# Patient Record
Sex: Female | Born: 1945 | Race: Black or African American | Hispanic: No | Marital: Single | Smoking: Former smoker
Health system: Southern US, Community
[De-identification: ages and names within clinical notes are randomized; demographics above are authoritative.]

## PROBLEM LIST (undated history)

## (undated) DIAGNOSIS — I10 Essential (primary) hypertension: Secondary | ICD-10-CM

## (undated) DIAGNOSIS — Z9889 Other specified postprocedural states: Secondary | ICD-10-CM

## (undated) DIAGNOSIS — E785 Hyperlipidemia, unspecified: Secondary | ICD-10-CM

## (undated) DIAGNOSIS — K5900 Constipation, unspecified: Secondary | ICD-10-CM

## (undated) DIAGNOSIS — M1990 Unspecified osteoarthritis, unspecified site: Secondary | ICD-10-CM

## (undated) DIAGNOSIS — K625 Hemorrhage of anus and rectum: Secondary | ICD-10-CM

## (undated) DIAGNOSIS — R112 Nausea with vomiting, unspecified: Secondary | ICD-10-CM

## (undated) HISTORY — PX: ABDOMINAL HYSTERECTOMY: SHX81

## (undated) HISTORY — PX: APPENDECTOMY: SHX54

## (undated) HISTORY — PX: BREAST EXCISIONAL BIOPSY: SUR124

---

## 2008-06-27 ENCOUNTER — Ambulatory Visit: Payer: Self-pay

## 2014-02-09 DIAGNOSIS — I10 Essential (primary) hypertension: Secondary | ICD-10-CM | POA: Insufficient documentation

## 2014-02-09 DIAGNOSIS — E7800 Pure hypercholesterolemia, unspecified: Secondary | ICD-10-CM | POA: Insufficient documentation

## 2014-10-03 ENCOUNTER — Ambulatory Visit: Payer: Self-pay | Admitting: Family Medicine

## 2014-10-11 ENCOUNTER — Ambulatory Visit: Payer: Self-pay | Admitting: Family Medicine

## 2015-03-14 IMAGING — US US BREAST*L* LIMITED INC AXILLA
1 series · 3 of 3 positions shown · non-contrast
Comparison: Prior

CLINICAL DATA: Screening callback for questioned left breast
subareolar masses

EXAM:
DIGITAL DIAGNOSTIC  left MAMMOGRAM
ULTRASOUND left BREAST

[Series 1: us breast*left* limited inc axilla · 0.08mm/px · 3 of 3 slices shown]
[im 1/3]
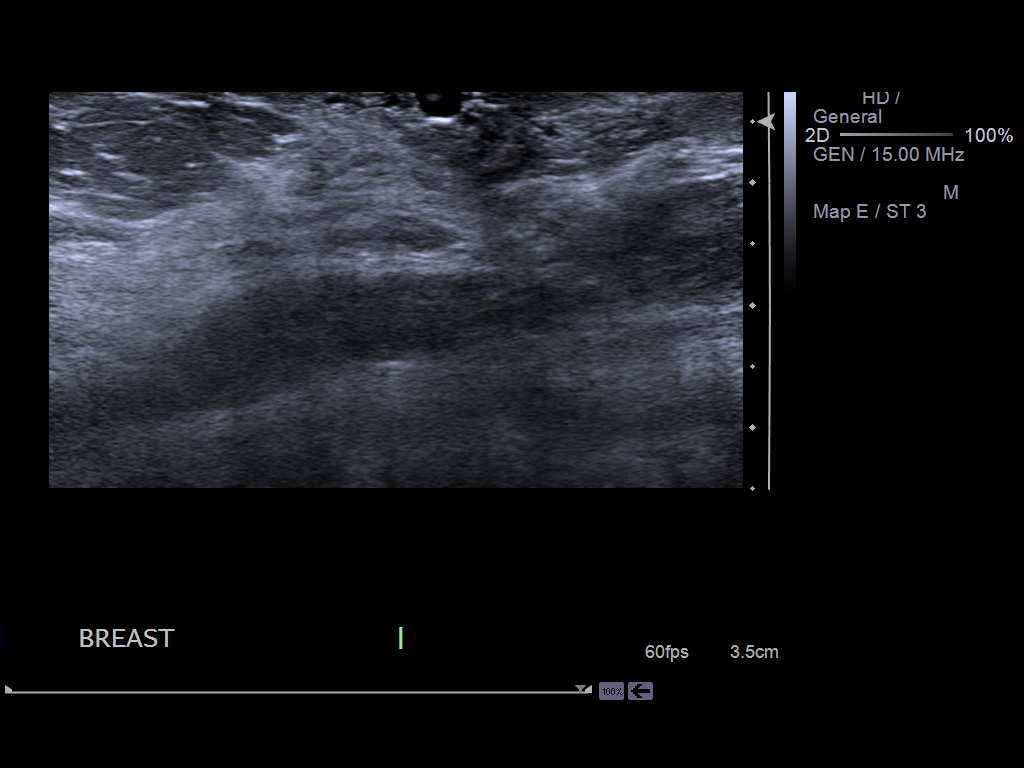
[im 2/3]
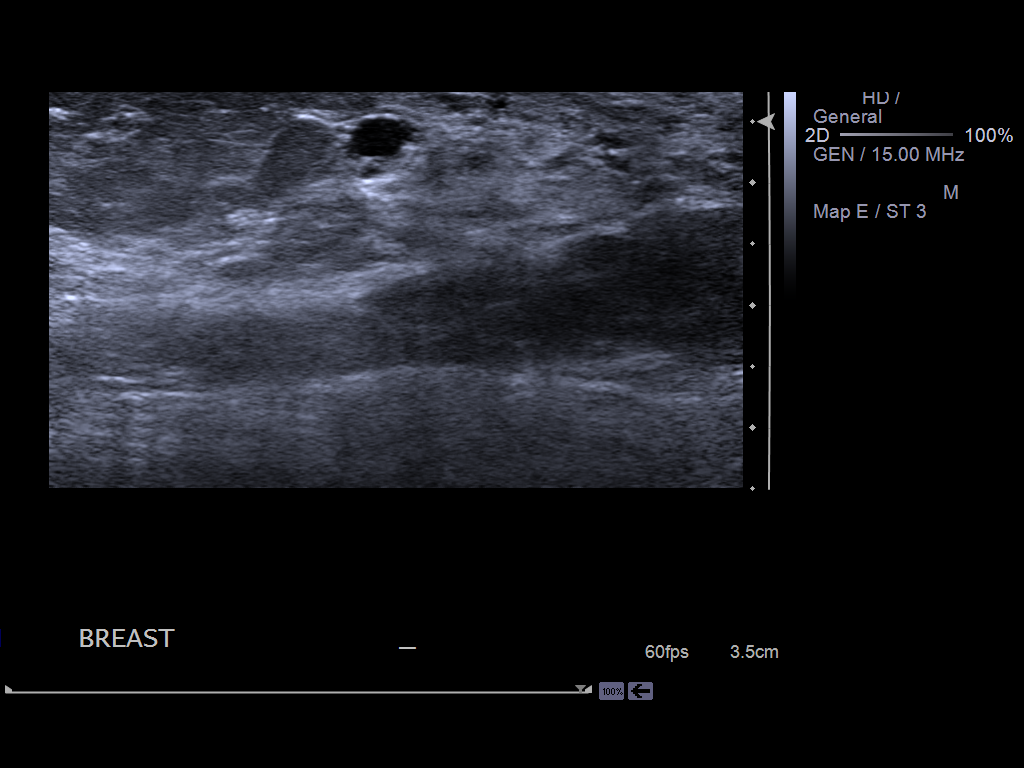
[im 3/3]
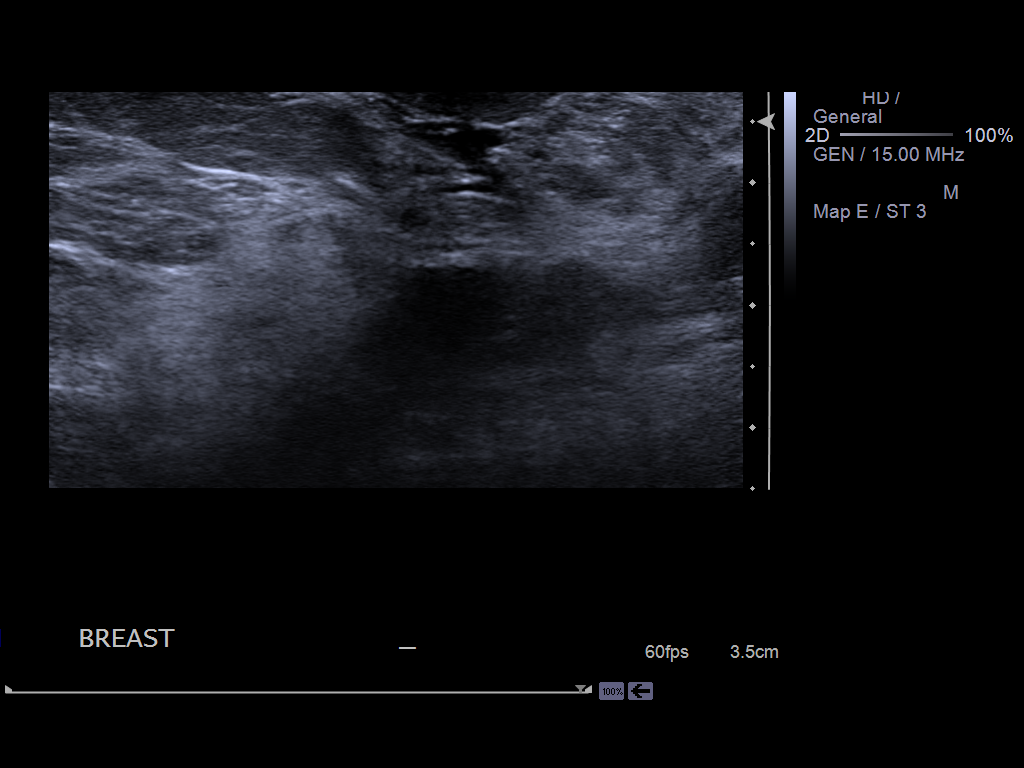

[3 of 3 positions shown; findings below may reference images not displayed]

ACR Breast Density Category c: The breast tissue is heterogeneously
dense, which may obscure small masses.
FINDINGS: The left breast retroareolar parenchyma is nodular without focal
measurable mass. Distortion from previous remote benign excisional
biopsy is reidentified.

On physical exam, I palpate no abnormality in the left retroareolar
breast.

Ultrasound is performed, showing scattered areas of duct ectasia in
the left retroareolar breast with out intraductal filling defect,
hypervascularity, or other abnormality identified.
IMPRESSION: Probably benign duct ectasia in the left retroareolar breast,
possibly related to scarring from remote prior surgery. No
intraductal abnormality is identified. This finding is of unknown
clinical significance and general consensus for this finding is to
pursue followup left diagnostic mammography and possibly ultrasound
in 6 months, for possible detection of an occult intraductal or
extra ductal mass.

RECOMMENDATION:
Left diagnostic mammogram and possibly ultrasound in 6 months

I have discussed the findings and recommendations with the patient.
Results were also provided in writing at the conclusion of the
visit. If applicable, a reminder letter will be sent to the patient
regarding the next appointment.

BI-RADS CATEGORY  3: Probably benign.

## 2015-03-28 ENCOUNTER — Other Ambulatory Visit: Payer: Self-pay | Admitting: Family Medicine

## 2015-03-28 DIAGNOSIS — Z1231 Encounter for screening mammogram for malignant neoplasm of breast: Secondary | ICD-10-CM

## 2015-07-10 NOTE — Discharge Instructions (Signed)

## 2015-07-11 ENCOUNTER — Ambulatory Visit: Payer: Medicare Other | Admitting: Anesthesiology

## 2015-07-11 ENCOUNTER — Ambulatory Visit
Admission: RE | Admit: 2015-07-11 | Discharge: 2015-07-11 | Disposition: A | Discharge status: Home / Self Care | Payer: Medicare Other | Source: Ambulatory Visit | Attending: Gastroenterology | Admitting: Gastroenterology

## 2015-07-11 ENCOUNTER — Encounter
Admission: RE | Disposition: A | Discharge status: Home / Self Care | Payer: Self-pay | Source: Ambulatory Visit | Attending: Gastroenterology

## 2015-07-11 ENCOUNTER — Encounter: Payer: Self-pay | Admitting: *Deleted

## 2015-07-11 DIAGNOSIS — K91840 Postprocedural hemorrhage and hematoma of a digestive system organ or structure following a digestive system procedure: Secondary | ICD-10-CM | POA: Diagnosis not present

## 2015-07-11 DIAGNOSIS — K922 Gastrointestinal hemorrhage, unspecified: Secondary | ICD-10-CM | POA: Diagnosis not present

## 2015-07-11 HISTORY — PX: COLONOSCOPY: SHX5424

## 2015-07-11 HISTORY — DX: Constipation, unspecified: K59.00

## 2015-07-11 HISTORY — PX: POLYPECTOMY: SHX149

## 2015-07-11 HISTORY — DX: Hemorrhage of anus and rectum: K62.5

## 2015-07-11 HISTORY — DX: Essential (primary) hypertension: I10

## 2015-07-11 HISTORY — DX: Unspecified osteoarthritis, unspecified site: M19.90

## 2015-07-11 HISTORY — DX: Hyperlipidemia, unspecified: E78.5

## 2015-07-11 HISTORY — DX: Other specified postprocedural states: Z98.890

## 2015-07-11 HISTORY — DX: Nausea with vomiting, unspecified: R11.2

## 2015-07-11 SURGERY — COLONOSCOPY
Anesthesia: Monitor Anesthesia Care

## 2015-07-11 MED ORDER — LIDOCAINE HCL (CARDIAC) 20 MG/ML IV SOLN: INTRAVENOUS | Status: DC | PRN

## 2015-07-11 MED ORDER — ACETAMINOPHEN 325 MG PO TABS: 325.0000 mg | ORAL_TABLET | ORAL | Status: DC | PRN

## 2015-07-11 MED ORDER — STERILE WATER FOR IRRIGATION IR SOLN
Status: DC | PRN
  Administered 2015-07-11: 09:00:00

## 2015-07-11 MED ORDER — PROPOFOL 10 MG/ML IV BOLUS
INTRAVENOUS | Status: DC | PRN
  Administered 2015-07-11 (×16): 20 mg via INTRAVENOUS

## 2015-07-11 MED ORDER — ACETAMINOPHEN 160 MG/5ML PO SOLN: 325.0000 mg | ORAL | Status: DC | PRN

## 2015-07-11 MED ORDER — LACTATED RINGERS IV SOLN
INTRAVENOUS | Status: DC
  Administered 2015-07-11: 09:00:00 via INTRAVENOUS

## 2015-07-11 SURGICAL SUPPLY — 30 items
CANISTER SUCT 1200ML W/VALVE (MISCELLANEOUS) ×4 IMPLANT
FCP ESCP3.2XJMB 240X2.8X (MISCELLANEOUS)
FORCEPS BIOP RAD 4 LRG CAP 4 (CUTTING FORCEPS) IMPLANT
FORCEPS BIOP RJ4 240 W/NDL (MISCELLANEOUS)
FORCEPS ESCP3.2XJMB 240X2.8X (MISCELLANEOUS) IMPLANT
GOWN CVR UNV OPN BCK APRN NK (MISCELLANEOUS) ×2 IMPLANT
GOWN ISOL THUMB LOOP REG UNIV (MISCELLANEOUS) ×2
GOWN STRL REUS W/ TWL LRG LVL3 (GOWN DISPOSABLE) ×2 IMPLANT
GOWN STRL REUS W/TWL LRG LVL3 (GOWN DISPOSABLE) ×2
HEMOCLIP INSTINCT (CLIP) ×8 IMPLANT
INJECTOR VARIJECT VIN23 (MISCELLANEOUS) IMPLANT
KIT CO2 TUBING (TUBING) IMPLANT
KIT DEFENDO VALVE AND CONN (KITS) IMPLANT
KIT ENDO PROCEDURE OLY (KITS) ×4 IMPLANT
LIGATOR MULTIBAND 6SHOOTER MBL (MISCELLANEOUS) IMPLANT
MARKER SPOT ENDO TATTOO 5ML (MISCELLANEOUS) IMPLANT
PAD GROUND ADULT SPLIT (MISCELLANEOUS) IMPLANT
SNARE SHORT THROW 13M SML OVAL (MISCELLANEOUS) IMPLANT
SNARE SHORT THROW 30M LRG OVAL (MISCELLANEOUS) IMPLANT
SPOT EX ENDOSCOPIC TATTOO (MISCELLANEOUS)
SUCTION POLY TRAP 4CHAMBER (MISCELLANEOUS) ×4 IMPLANT
TRAP SUCTION POLY (MISCELLANEOUS) IMPLANT
TUBING CONN 6MMX3.1M (TUBING)
TUBING SUCTION CONN 0.25 STRL (TUBING) IMPLANT
UNDERPAD 30X60 958B10 (PK) (MISCELLANEOUS) IMPLANT
VALVE BIOPSY ENDO (VALVE) IMPLANT
VARIJECT INJECTOR VIN23 (MISCELLANEOUS)
WATER AUXILLARY (MISCELLANEOUS) IMPLANT
WATER STERILE IRR 250ML POUR (IV SOLUTION) IMPLANT
WATER STERILE IRR 500ML POUR (IV SOLUTION) IMPLANT

## 2015-07-11 NOTE — Anesthesia Postprocedure Evaluation (Signed)
  Anesthesia Post-op Note  Patient: Dawn Santana  Procedure(s) Performed: Procedure(s): COLONOSCOPY (N/A) POLYPECTOMY INTESTINAL  Anesthesia type:MAC  Patient location: PACU  Post pain: Pain level controlled  Post assessment: Post-op Vital signs reviewed, Patient's Cardiovascular Status Stable, Respiratory Function Stable, Patent Airway and No signs of Nausea or vomiting  Post vital signs: Reviewed and stable  Last Vitals:  Filed Vitals:   07/11/15 1000  BP: 114/68  Pulse: 65  Temp:   Resp: 18    Level of consciousness: awake, alert  and patient cooperative  Complications: No apparent anesthesia complications

## 2015-07-11 NOTE — H&P (Signed)
  Date of Initial H&P:06/26/2015 History reviewed, patient examined, no change in status, stable for surgery. 

## 2015-07-11 NOTE — Anesthesia Preprocedure Evaluation (Signed)
Anesthesia Evaluation  Patient identified by MRN, date of birth, ID band  Reviewed: Allergy & Precautions, H&P , NPO status , Patient's Chart, lab work & pertinent test results  Airway Mallampati: I  TM Distance: >3 FB Neck ROM: full    Dental no notable dental hx.    Pulmonary former smoker,    Pulmonary exam normal       Cardiovascular hypertension, Rhythm:regular Rate:Normal     Neuro/Psych    GI/Hepatic   Endo/Other    Renal/GU      Musculoskeletal   Abdominal   Peds  Hematology   Anesthesia Other Findings   Reproductive/Obstetrics                             Anesthesia Physical Anesthesia Plan  ASA: II  Anesthesia Plan: MAC   Post-op Pain Management:    Induction:   Airway Management Planned:   Additional Equipment:   Intra-op Plan:   Post-operative Plan:   Informed Consent: I have reviewed the patients History and Physical, chart, labs and discussed the procedure including the risks, benefits and alternatives for the proposed anesthesia with the patient or authorized representative who has indicated his/her understanding and acceptance.     Plan Discussed with: CRNA  Anesthesia Plan Comments:         Anesthesia Quick Evaluation  

## 2015-07-11 NOTE — Transfer of Care (Signed)
Immediate Anesthesia Transfer of Care Note  Patient: Dawn Santana  Procedure(s) Performed: Procedure(s): COLONOSCOPY (N/A) POLYPECTOMY INTESTINAL  Patient Location: PACU  Anesthesia Type: MAC  Level of Consciousness: awake, alert  and patient cooperative  Airway and Oxygen Therapy: Patient Spontanous Breathing and Patient connected to supplemental oxygen  Post-op Assessment: Post-op Vital signs reviewed, Patient's Cardiovascular Status Stable, Respiratory Function Stable, Patent Airway and No signs of Nausea or vomiting  Post-op Vital Signs: Reviewed and stable  Complications: No apparent anesthesia complications

## 2015-07-11 NOTE — Anesthesia Procedure Notes (Signed)
Procedure Name: MAC Performed by: Benedetta Sundstrom Pre-anesthesia Checklist: Patient identified, Emergency Drugs available, Suction available, Patient being monitored and Timeout performed Patient Re-evaluated:Patient Re-evaluated prior to inductionOxygen Delivery Method: Nasal cannula Intubation Type: IV induction     

## 2015-07-11 NOTE — Op Note (Signed)
[facility] Patient Name: Dawn Santana Procedure Date: 07/11/2015 9:09 AM MRN: [id number] Account #: [id number] Date of Birth: 1946/01/16 Admit Type: Outpatient Age: 69 Room: [facility] OR ROOM 01 Gender: Female Note Status: Finalized Procedure:         Colonoscopy Indications:       Heme positive stool Providers:         Ezzard Standing. Bluford Kaufmann, MD Referring MD:      Haynes Kerns (Referring MD) Medicines:         Monitored Anesthesia Care Complications:     No immediate complications. Procedure:         Pre-Anesthesia Assessment:                    - Prior to the procedure, a History and Physical was                     performed, and patient medications, allergies and                     sensitivities were reviewed. The patient's tolerance of                     previous anesthesia was reviewed.                    - The risks and benefits of the procedure and the sedation                     options and risks were discussed with the patient. All                     questions were answered and informed consent was obtained.                    - After reviewing the risks and benefits, the patient was                     deemed in satisfactory condition to undergo the procedure.                    After obtaining informed consent, the colonoscope was                     passed under direct vision. Throughout the procedure, the                     patient's blood pressure, pulse, and oxygen saturations                     were monitored continuously. The Olympus CF H180AL                     colonoscope (S#: [id number]) was introduced through the anus                     and advanced to the the cecum, identified by appendiceal                     orifice and ileocecal valve. The colonoscopy was performed                     without difficulty. The patient tolerated the procedure  well. The quality of the bowel preparation was good. Findings:       Many sessile polyps were found in the cecum. The polyps were small in       size. These polyps were removed with a hot snare. Resection and       retrieval were complete.      A large polyp was found in the cecum. The polyp was sessile. The polyp       was removed with a hot snare. Resection and retrieval were complete. To       prevent bleeding after the polypectomy, one hemostatic clip was       successfully placed. There was no bleeding at the end of the procedure.      A few sessile polyps were found in the ascending colon. The polyps were       small in size. These polyps were removed with a hot snare. Resection and       retrieval were complete.      A large polyp was found in the ascending colon. The polyp was sessile.       The polyp was removed with a hot snare. Resection and retrieval were       complete.      A small polyp was found in the rectum. The polyp was sessile. The polyp       was removed with a hot snare. Resection and retrieval were complete.      A large polyp was found in the rectum. The polyp was sessile. The polyp       was removed with a hot snare. Resection and retrieval were complete.      The exam was otherwise without abnormality.      There was a medium-sized lipoma, in the sigmoid colon. Impression:        - Many small polyps in the cecum. Resected and retrieved.                    - One large polyp in the cecum. Resected and retrieved.                     Clip was placed.                    - A few small polyps in the ascending colon. Resected and                     retrieved.                    - One large polyp in the ascending colon. Resected and                     retrieved.                    - One small polyp in the rectum. Resected and retrieved.                    - One large polyp in the rectum. Resected and retrieved.                    - The examination was otherwise normal.                    - Medium-sized lipoma in the sigmoid  colon. Recommendation:    - Discharge patient to  home.                    - Await pathology results.                    - Repeat colonoscopy in 6 months for surveillance based on                     pathology results. Procedure Code(s): --- Professional ---                    [phone], Colonoscopy, flexible; with removal of tumor(s),                     polyp(s), or other lesion(s) by snare technique Diagnosis Code(s): --- Professional ---                    D12.0, Benign neoplasm of cecum                    D12.2, Benign neoplasm of ascending colon                    K62.1, Rectal polyp                    D17.5, Benign lipomatous neoplasm of intra-abdominal organs                    R19.5, Other fecal abnormalities CPT copyright 2014 American Medical Association. All rights reserved. The codes documented in this report are preliminary and upon coder review may  be revised to meet current compliance requirements. Wallace Cullens, MD 07/11/2015 9:52:35 AM This report has been signed electronically. Number of Addenda: 0 Note Initiated On: 07/11/2015 9:09 AM Scope Withdrawal Time: 0 hours 28 minutes 24 seconds  Total Procedure Duration: 0 hours 32 minutes 23 seconds       [facility]

## 2015-07-12 ENCOUNTER — Inpatient Hospital Stay
Admission: EM | Admit: 2015-07-12 | Discharge: 2015-07-14 | DRG: 920 | Disposition: A | Discharge status: Home / Self Care | Payer: Medicare Other | Attending: Internal Medicine | Admitting: Internal Medicine

## 2015-07-12 ENCOUNTER — Encounter: Payer: Self-pay | Admitting: Emergency Medicine

## 2015-07-12 DIAGNOSIS — I959 Hypotension, unspecified: Secondary | ICD-10-CM | POA: Diagnosis present

## 2015-07-12 DIAGNOSIS — Z79899 Other long term (current) drug therapy: Secondary | ICD-10-CM

## 2015-07-12 DIAGNOSIS — I10 Essential (primary) hypertension: Secondary | ICD-10-CM | POA: Diagnosis present

## 2015-07-12 DIAGNOSIS — Y838 Other surgical procedures as the cause of abnormal reaction of the patient, or of later complication, without mention of misadventure at the time of the procedure: Secondary | ICD-10-CM | POA: Diagnosis present

## 2015-07-12 DIAGNOSIS — M179 Osteoarthritis of knee, unspecified: Secondary | ICD-10-CM | POA: Diagnosis present

## 2015-07-12 DIAGNOSIS — D62 Acute posthemorrhagic anemia: Secondary | ICD-10-CM | POA: Diagnosis present

## 2015-07-12 DIAGNOSIS — K91840 Postprocedural hemorrhage and hematoma of a digestive system organ or structure following a digestive system procedure: Secondary | ICD-10-CM | POA: Diagnosis present

## 2015-07-12 DIAGNOSIS — K922 Gastrointestinal hemorrhage, unspecified: Secondary | ICD-10-CM

## 2015-07-12 DIAGNOSIS — E876 Hypokalemia: Secondary | ICD-10-CM | POA: Diagnosis present

## 2015-07-12 DIAGNOSIS — D6489 Other specified anemias: Secondary | ICD-10-CM

## 2015-07-12 DIAGNOSIS — Z87891 Personal history of nicotine dependence: Secondary | ICD-10-CM | POA: Diagnosis not present

## 2015-07-12 DIAGNOSIS — E785 Hyperlipidemia, unspecified: Secondary | ICD-10-CM | POA: Diagnosis present

## 2015-07-12 DIAGNOSIS — Z23 Encounter for immunization: Secondary | ICD-10-CM

## 2015-07-12 DIAGNOSIS — E861 Hypovolemia: Secondary | ICD-10-CM | POA: Diagnosis present

## 2015-07-12 DIAGNOSIS — I4891 Unspecified atrial fibrillation: Secondary | ICD-10-CM | POA: Diagnosis present

## 2015-07-12 DIAGNOSIS — Z888 Allergy status to other drugs, medicaments and biological substances status: Secondary | ICD-10-CM

## 2015-07-12 LAB — HEMOGLOBIN AND HEMATOCRIT, BLOOD
HCT: 25.6 % — ABNORMAL LOW (ref 35.0–47.0)
Hemoglobin: 8.9 g/dL — ABNORMAL LOW (ref 12.0–16.0)

## 2015-07-12 LAB — CBC WITH DIFFERENTIAL/PLATELET
BASOS ABS: 0 10*3/uL (ref 0–0.1)
BASOS PCT: 0 %
EOS PCT: 1 %
Eosinophils Absolute: 0.1 10*3/uL (ref 0–0.7)
HCT: 25 % — ABNORMAL LOW (ref 35.0–47.0)
Hemoglobin: 8.5 g/dL — ABNORMAL LOW (ref 12.0–16.0)
Lymphocytes Relative: 14 %
Lymphs Abs: 1.7 10*3/uL (ref 1.0–3.6)
MCH: 30.3 pg (ref 26.0–34.0)
MCHC: 34.1 g/dL (ref 32.0–36.0)
MCV: 88.7 fL (ref 80.0–100.0)
MONO ABS: 0.7 10*3/uL (ref 0.2–0.9)
Monocytes Relative: 6 %
Neutro Abs: 9.5 10*3/uL — ABNORMAL HIGH (ref 1.4–6.5)
Neutrophils Relative %: 79 %
PLATELETS: 180 10*3/uL (ref 150–440)
RBC: 2.82 MIL/uL — ABNORMAL LOW (ref 3.80–5.20)
RDW: 13.5 % (ref 11.5–14.5)
WBC: 12.1 10*3/uL — ABNORMAL HIGH (ref 3.6–11.0)

## 2015-07-12 LAB — COMPREHENSIVE METABOLIC PANEL
ALBUMIN: 3 g/dL — AB (ref 3.5–5.0)
ALT: 11 U/L — ABNORMAL LOW (ref 14–54)
ANION GAP: 6 (ref 5–15)
AST: 15 U/L (ref 15–41)
Alkaline Phosphatase: 34 U/L — ABNORMAL LOW (ref 38–126)
BUN: 12 mg/dL (ref 6–20)
CHLORIDE: 111 mmol/L (ref 101–111)
CO2: 25 mmol/L (ref 22–32)
Calcium: 7.9 mg/dL — ABNORMAL LOW (ref 8.9–10.3)
Creatinine, Ser: 0.83 mg/dL (ref 0.44–1.00)
GFR calc Af Amer: >60 mL/min (ref >60)
Glucose, Bld: 156 mg/dL — ABNORMAL HIGH (ref 65–99)
POTASSIUM: 2.8 mmol/L — AB (ref 3.5–5.1)
Sodium: 142 mmol/L (ref 135–145)
Total Bilirubin: 0.8 mg/dL (ref 0.3–1.2)
Total Protein: 4.9 g/dL — ABNORMAL LOW (ref 6.5–8.1)

## 2015-07-12 LAB — URINALYSIS COMPLETE WITH MICROSCOPIC (ARMC ONLY)
BILIRUBIN URINE: NEGATIVE
GLUCOSE, UA: NEGATIVE mg/dL
KETONES UR: NEGATIVE mg/dL
Leukocytes, UA: NEGATIVE
NITRITE: NEGATIVE
PROTEIN: NEGATIVE mg/dL
SPECIFIC GRAVITY, URINE: 1.005 (ref 1.005–1.030)
pH: 7 (ref 5.0–8.0)

## 2015-07-12 LAB — PREPARE RBC (CROSSMATCH): Order Confirmation: POSITIVE

## 2015-07-12 LAB — MRSA PCR SCREENING: MRSA BY PCR: NEGATIVE

## 2015-07-12 LAB — TROPONIN I

## 2015-07-12 LAB — PROTIME-INR
INR: 1.18
PROTHROMBIN TIME: 15.2 s — AB (ref 11.4–15.0)

## 2015-07-12 LAB — POTASSIUM: POTASSIUM: 3.4 mmol/L — AB (ref 3.5–5.1)

## 2015-07-12 LAB — MAGNESIUM: Magnesium: 1.6 mg/dL — ABNORMAL LOW (ref 1.7–2.4)

## 2015-07-12 LAB — ABO/RH: ABO/RH(D): O POS

## 2015-07-12 MED ORDER — MAGNESIUM SULFATE 2 GM/50ML IV SOLN
2.0000 g | Freq: Once | INTRAVENOUS | Status: AC
  Administered 2015-07-12: 2 g via INTRAVENOUS
  Filled 2015-07-12: qty 50

## 2015-07-12 MED ORDER — POTASSIUM CHLORIDE IN NACL 20-0.9 MEQ/L-% IV SOLN
INTRAVENOUS | Status: DC
  Administered 2015-07-12 – 2015-07-13 (×3): via INTRAVENOUS
  Filled 2015-07-12 (×6): qty 1000

## 2015-07-12 MED ORDER — POTASSIUM CHLORIDE CRYS ER 20 MEQ PO TBCR
40.0000 meq | EXTENDED_RELEASE_TABLET | Freq: Two times a day (BID) | ORAL | Status: DC
  Administered 2015-07-12 – 2015-07-13 (×2): 40 meq via ORAL
  Filled 2015-07-12 (×3): qty 2

## 2015-07-12 MED ORDER — ACETAMINOPHEN 325 MG PO TABS: 650.0000 mg | ORAL_TABLET | Freq: Four times a day (QID) | ORAL | Status: DC | PRN

## 2015-07-12 MED ORDER — POTASSIUM CHLORIDE CRYS ER 20 MEQ PO TBCR
60.0000 meq | EXTENDED_RELEASE_TABLET | Freq: Once | ORAL | Status: AC
  Administered 2015-07-12: 60 meq via ORAL
  Filled 2015-07-12: qty 3

## 2015-07-12 MED ORDER — INFLUENZA VAC SPLIT QUAD 0.5 ML IM SUSY: 0.5000 mL | PREFILLED_SYRINGE | INTRAMUSCULAR | Status: DC

## 2015-07-12 MED ORDER — SODIUM CHLORIDE 0.9 % IJ SOLN
3.0000 mL | Freq: Two times a day (BID) | INTRAMUSCULAR | Status: DC
  Administered 2015-07-12 – 2015-07-14 (×3): 3 mL via INTRAVENOUS

## 2015-07-12 MED ORDER — SODIUM CHLORIDE 0.9 % IV SOLN
10.0000 mL/h | Freq: Once | INTRAVENOUS | Status: AC
  Administered 2015-07-12: 10 mL/h via INTRAVENOUS

## 2015-07-12 MED ORDER — ACETAMINOPHEN 650 MG RE SUPP: 650.0000 mg | Freq: Four times a day (QID) | RECTAL | Status: DC | PRN

## 2015-07-12 MED ORDER — PNEUMOCOCCAL VAC POLYVALENT 25 MCG/0.5ML IJ INJ: 0.5000 mL | INJECTION | INTRAMUSCULAR | Status: DC

## 2015-07-12 NOTE — Progress Notes (Signed)
RN notified Dr. Renae Gloss that patients repeat hgb is 8.9 and that potassium has not resulted as of yet.  RN notified MD that patient has not passed any blood since admission and that patient's blood pressure has maintained. MD gave order to transfer patient to any floor with tele.

## 2015-07-12 NOTE — ED Provider Notes (Signed)
[facility] Emergency Department Provider Note     Time seen: ----------------------------------------- 7:38 AM on 07/12/2015 -----------------------------------------    I have reviewed the triage vital signs and the nursing notes.   HISTORY  Chief Complaint No chief complaint on file.    HPI Dawn Santana is a 69 y.o. female who presents to ER with acute rectal bleeding. Patient had a colonoscopy performed by Dr. Bluford Kaufmann yesterday.She states that afterwards everything was going well, she was not having bleeding. Suddenly in the middle of night she began having rectal bleeding, it felt like she needed to have a bowel movement and she was passing pure blood. This happened again about 4 AM and then later in the morning she passed out after she sat up on the side of the bed. EMS got there and she was hypotensive. They have started IV fluid on her and her blood pressure has returned to normal. Currently supine she has no complaints, has not had any further bleeding since she's been here. She has not had a history of this before, denies any abdominal pain.   Past Medical History  Diagnosis Date  . Hyperlipidemia   . Rectal bleeding   . Constipation     occasional  . Hypertension   . PONV (postoperative nausea and vomiting)   . Arthritis     knees and joints    There are no active problems to display for this patient.   Past Surgical History  Procedure Laterality Date  . Appendectomy    . Abdominal hysterectomy      Allergies Other  Social History Social History  Substance Use Topics  . Smoking status: Former Smoker -- 0.25 packs/day for 6 years    Types: Cigarettes    Quit date: 03/21/1989  . Smokeless tobacco: Never Used  . Alcohol Use: Yes     Comment: occasional glass of wine or mixed drink    Review of Systems Constitutional: Negative for fever. Eyes: Negative for visual changes. ENT: Negative for sore throat. Cardiovascular:  Negative for chest pain. Respiratory: Negative for shortness of breath. Gastrointestinal: Negative for abdominal pain, vomiting and diarrhea. Positive for rectal bleeding Genitourinary: Negative for dysuria. Musculoskeletal: Negative for back pain. Skin: Negative for rash. Neurological: Negative for headaches, positive for weakness  10-point ROS otherwise negative.  ____________________________________________   PHYSICAL EXAM:  VITAL SIGNS: ED Triage Vitals  Enc Vitals Group     BP --      Pulse --      Resp --      Temp --      Temp src --      SpO2 --      Weight --      Height --      Head Cir --      Peak Flow --      Pain Score --      Pain Loc --      Pain Edu? --      Excl. in GC? --     Constitutional: Alert and oriented. Well appearing and in no distress. Eyes: Conjunctivae are normal. PERRL. Normal extraocular movements. ENT   Head: Normocephalic and atraumatic.   Nose: No congestion/rhinnorhea.   Mouth/Throat: Mucous membranes are moist.   Neck: No stridor. Cardiovascular: Normal rate, regular rhythm. Normal and symmetric distal pulses are present in all extremities. No murmurs, rubs, or gallops. Respiratory: Normal respiratory effort without tachypnea nor retractions. Breath sounds are clear and equal bilaterally.  No wheezes/rales/rhonchi. Gastrointestinal: Soft and nontender. No distention. No abdominal bruits.  Rectal: Bright red blood is noted rectally. Musculoskeletal: Nontender with normal range of motion in all extremities. No joint effusions.  No lower extremity tenderness nor edema. Neurologic:  Normal speech and language. No gross focal neurologic deficits are appreciated. Speech is normal. No gait instability. Skin:  Skin is warm, dry and intact. No rash noted. Psychiatric: Mood and affect are normal. Speech and behavior are normal. Patient exhibits appropriate insight and judgment. ____________________________________________  EKG:  Interpreted by me. Atrial fibrillation with a rate of 79 bpm, nonspecific ST and T-wave changes, normal axis. No evidence of acute infarction.  ____________________________________________  ED COURSE:  Pertinent labs & imaging results that were available during my care of the patient were reviewed by me and considered in my medical decision making (see chart for details). I have ordered a emergency release blood for the patient, she will continue saline infusion. We'll check vitals and labs and reevaluate. ____________________________________________    LABS (pertinent positives/negatives)  Labs Reviewed  CBC WITH DIFFERENTIAL/PLATELET - Abnormal; Notable for the following:    WBC 12.1 (*)    RBC 2.82 (*)    Hemoglobin 8.5 (*)    HCT 25.0 (*)    Neutro Abs 9.5 (*)    All other components within normal limits  COMPREHENSIVE METABOLIC PANEL - Abnormal; Notable for the following:    Potassium 2.8 (*)    Glucose, Bld 156 (*)    Calcium 7.9 (*)    Total Protein 4.9 (*)    Albumin 3.0 (*)    ALT 11 (*)    Alkaline Phosphatase 34 (*)    All other components within normal limits  PROTIME-INR - Abnormal; Notable for the following:    Prothrombin Time 15.2 (*)    All other components within normal limits  TROPONIN I  URINALYSIS COMPLETEWITH MICROSCOPIC ([facility] ONLY)  CBG MONITORING, ED  TYPE AND SCREEN   CRITICAL CARE Performed by: Emily Filbert   Total critical care time: 30 minutes  Critical care time was exclusive of separately billable procedures and treating other patients.  Critical care was necessary to treat or prevent imminent or life-threatening deterioration.  Critical care was time spent personally by me on the following activities: development of treatment plan with patient and/or surrogate as well as nursing, discussions with consultants, evaluation of patient's response to treatment, examination of patient, obtaining history from patient or surrogate,  ordering and performing treatments and interventions, ordering and review of laboratory studies, ordering and review of radiographic studies, pulse oximetry and re-evaluation of patient's condition.  ____________________________________________  FINAL ASSESSMENT AND PLAN  Rectal bleeding, atrial fibrillation  Plan: Patient with labs and imaging as dictated above. Case was discussed with Dr. Bluford Kaufmann, he states he has not entirely surprised by her bleeding. She had large polyps on colonoscopy yesterday that required clips to be placed. Atrial fibrillations likely secondary to anemia. She will likely need a blood transfusion, will need serial H&H and reevaluation. At this point her heart rate and blood pressure are normal.   Emily Filbert, MD   Emily Filbert, MD 07/12/15 [phone]

## 2015-07-12 NOTE — Consult Note (Signed)
GI Inpatient Consult Note  Reason for Consult:  Rectal bleeding post colonoscopy   History of Present Illness: Dawn Santana is a 69 y.o. female reports that she had a colonoscopy yesterday as outpatient, went home and felt fine.  Had lunch and then had dinner.  She reports she woke up about 2 o'clock this morning, thinking she had to urinate, and experienced bright red blood per rectum.  She laid back down and reports she woke up again about 430 and experienced another episode, however this time with dark blood.  She reports 2 more episodes of dark blood.  She reports that she became very dizzy and began sweating.  She had a syncopal episode and her daughter called EMS.  In emergency department they determined that she was currently in AFib, had a hemoglobin level of 8.5, low potassium.  Her colonoscopy was significant for several large polyps.  One polyp, after removed had a hemostatic clip successfully placed.  There was no bleeding at the end of the procedure.  She is due for repeat colonoscopy in 6 months.  Past Medical History:  Past Medical History  Diagnosis Date  . Hyperlipidemia   . Rectal bleeding   . Constipation     occasional  . Hypertension   . PONV (postoperative nausea and vomiting)   . Arthritis     knees and joints    Problem List: Patient Active Problem List   Diagnosis Date Noted  . Acute post-hemorrhagic anemia 07/12/2015    Past Surgical History: Past Surgical History  Procedure Laterality Date  . Appendectomy    . Abdominal hysterectomy    . Colonoscopy N/A 07/11/2015    Procedure: COLONOSCOPY;  Surgeon: Wallace Cullens, MD;  Location: [facility];  Service: Gastroenterology;  Laterality: N/A;  . Polypectomy  07/11/2015    Procedure: POLYPECTOMY INTESTINAL;  Surgeon: Wallace Cullens, MD;  Location: [facility];  Service: Gastroenterology;;  . Breast biopsy      Allergies: Allergies  Allergen Reactions  . Other Other (See Comments)   Statins-hmg-coa reductase inhibitors give muscle aches    Home Medications: Prescriptions prior to admission  Medication Sig Dispense Refill Last Dose  . Ascorbic Acid (VITAMIN C) 1000 MG tablet Take 1,000 mg by mouth daily.   07/11/2015 at Unknown time  . Calcium-Magnesium-Vitamin D 185-50-100 MG-MG-UNIT CAPS Take 2 tablets by mouth daily.   07/11/2015 at Unknown time  . Cholecalciferol (VITAMIN D-3) 1000 UNITS CAPS Take by mouth.   07/11/2015 at Unknown time  . Multiple Vitamin (MULTIVITAMIN) tablet Take 1 tablet by mouth daily.   07/11/2015 at Unknown time  . omega-3 fish oil (MAXEPA) 1000 MG CAPS capsule Take 3 capsules by mouth daily.   07/11/2015 at Unknown time  . potassium chloride (K-DUR,KLOR-CON) 10 MEQ tablet Take 10 mEq by mouth daily.   07/11/2015 at Unknown time  . triamterene-hydrochlorothiazide (MAXZIDE) 75-50 MG tablet Take 1 tablet by mouth daily.   07/11/2015 at Unknown time  . vitamin B-12 (CYANOCOBALAMIN) 1000 MCG tablet Take 1,000 mcg by mouth daily.   07/11/2015 at Unknown time  . vitamin E 1000 UNIT capsule Take 1,000 Units by mouth daily.   07/11/2015 at Unknown time   Home medication reconciliation was completed with the patient.   Scheduled Inpatient Medications:   . potassium chloride  40 mEq Oral BID  . sodium chloride  3 mL Intravenous Q12H    Continuous Inpatient Infusions:   . 0.9 % NaCl with KCl 20  mEq / L 75 mL/hr at 07/12/15 1053    PRN Inpatient Medications:  acetaminophen **OR** acetaminophen  Family History: family history includes Breast cancer in her mother; Diabetes in her mother; Heart failure in her mother.    Social History:   reports that she quit smoking about 26 years ago. Her smoking use included Cigarettes. She has a 1.5 pack-year smoking history. She has never used smokeless tobacco. She reports that she drinks alcohol. She reports that she does not use illicit drugs.   Review of Systems: Constitutional: Weight is stable.  Eyes: No  changes in vision. ENT: No oral lesions, sore throat.  GI: see HPI.  Heme/Lymph: No easy bruising.  CV: No chest pain.  GU: No hematuria.  Integumentary: No rashes.  Neuro: No headaches.  Psych: No depression/anxiety.  Endocrine: No heat/cold intolerance.  Allergic/Immunologic: No urticaria.  Resp: No cough, SOB.  Musculoskeletal: No joint swelling.    Physical Examination: BP 113/71 mmHg  Pulse 85  Temp(Src) 98.6 F (37 C) (Axillary)  Resp 14  Ht  (1.803 m)  Wt 91.7 kg (202 lb 2.6 oz)  BMI 28.21 kg/m2  SpO2 100% Gen: NAD, alert and oriented x 4 HEENT: PEERLA, EOMI, Neck: supple, no JVD or thyromegaly Chest: CTA bilaterally, no wheezes, crackles, or other adventitious sounds CV: RRR, no m/g/c/r Abd: soft, slight generalized tenderness, ND, +BS in all four quadrants; no HSM, guarding, ridigity, or rebound tenderness Ext: no edema, well perfused with 2+ pulses, Skin: no rash or lesions noted Lymph: no LAD  Data: Lab Results  Component Value Date   WBC 12.1* 07/12/2015   HGB 8.5* 07/12/2015   HCT 25.0* 07/12/2015   MCV 88.7 07/12/2015   PLT 180 07/12/2015    Recent Labs Lab 07/12/15 0741  HGB 8.5*   Lab Results  Component Value Date   NA 142 07/12/2015   K 2.8* 07/12/2015   CL 111 07/12/2015   CO2 25 07/12/2015   BUN 12 07/12/2015   CREATININE 0.83 07/12/2015   Lab Results  Component Value Date   ALT 11* 07/12/2015   AST 15 07/12/2015   ALKPHOS 34* 07/12/2015   BILITOT 0.8 07/12/2015    Recent Labs Lab 07/12/15 0741  INR 1.18   Assessment/Plan: [name] is a 69 y.o. female with acute rectal bleeding status post colonoscopy.  Last episode of bleeding was around 8am this morning.  hgb 8.5, potassium 2.8, magnesium 1.6.  Recommendations: Per Dr. Bluford Kaufmann; Post polypectomy bleeding. Near syncope with low BP and anemia. Low K. Afib initially. Now in NSR. No further bleeding since coming to ER. Continue IVF. Continue to moniter for bleeding.  Transfuse as needed. If more bleeding today, then bowel prep tonight for repeat colonoscopy to possibly place more hemoclips to bleeding site. Thanks.  We will continue to follow with you. Thank you for the consult. Please call with questions or concerns.  Carney Harder, PA-C  I personally performed these services.

## 2015-07-12 NOTE — Consult Note (Signed)
  Pt seen and examined. Please see C. Peggye Pitt' notes. Post polypectomy bleeding. Near syncope with low BP and anemia. Low K. Afib initially. Now in NSR. No further bleeding since coming to ER. Continue IVF. Continue to moniter for bleeding. Transfuse as needed. If more bleeding today, then bowel prep tonight for repeat colonoscopy to possibly place more hemoclips to bleeding site. Thanks.

## 2015-07-12 NOTE — Progress Notes (Signed)
Patient moved to room 126 at this time by bed with Kathlene November, orderly.  Off unit tele monitor on and verified with Evon, tele clerk.

## 2015-07-12 NOTE — ED Notes (Signed)
Dr Mayford Knife notified of critical potassium value at this time.

## 2015-07-12 NOTE — Progress Notes (Signed)
Report called to [facility], RN on 1C. Patient is A&Ox4. Denies pain. No blood or blood in stool. VSS. Tolerating clear liquid diet. Family visited throughout shift. Patient will be moving to room 126.

## 2015-07-12 NOTE — H&P (Signed)
[facility] - [place] at [facility]   PATIENT NAME: Dawn Santana    MR#:  [id number]  DATE OF BIRTH:  [birth year]  DATE OF ADMISSION:  07/12/2015  PRIMARY CARE PHYSICIAN: Sula Rumple, MD   REQUESTING/REFERRING PHYSICIAN: Daryel November, M.D.  CHIEF COMPLAINT:   Chief Complaint  Patient presents with  . Rectal Bleeding    HISTORY OF PRESENT ILLNESS:  [name]  is a 69 y.o. female with a known history of hypertension and hypokalemia. She had a screening colonoscopy done yesterday by Dr. Bluford Kaufmann gastroenterology for microscopic blood on guaiac cards. She had numerous polyps removed including some large polyps in the cecum. She had some abdominal cramping last night. At 2:15 this morning she felt like she had some diarrhea she wiped and she saw blood. Noticed about a cupful of blood at that time in the toilet. At 4:15 AM again had about a couple blood loss from the rectum. At 6:15 AM, she felt like she was going to pass out she stood up and she was leaning against the closet and then she collapsed and fell to the floor. No loss of consciousness. Blood rushed out with clumps and she felt sweaty and hot. When EMS got her up to the stretcher she lost more blood. In the ER her hemoglobin was 8.5 and a recent hemoglobin was 13.4 a few weeks ago. The ER physician ordered for a transfusion of blood. Initial EMS blood pressure was in the 70s systolic. She was also found to be in atrial fibrillation.  PAST MEDICAL HISTORY:   Past Medical History  Diagnosis Date  . Hyperlipidemia   . Rectal bleeding   . Constipation     occasional  . Hypertension   . PONV (postoperative nausea and vomiting)   . Arthritis     knees and joints    PAST SURGICAL HISTORY:   Past Surgical History  Procedure Laterality Date  . Appendectomy    . Abdominal hysterectomy    . Colonoscopy N/A 07/11/2015    Procedure: COLONOSCOPY;  Surgeon: Wallace Cullens, MD;  Location: [facility];  Service: Gastroenterology;  Laterality: N/A;  . Polypectomy  07/11/2015    Procedure: POLYPECTOMY INTESTINAL;  Surgeon: Wallace Cullens, MD;  Location: [facility];  Service: Gastroenterology;;  . Breast biopsy      SOCIAL HISTORY:   Social History  Substance Use Topics  . Smoking status: Former Smoker -- 0.25 packs/day for 6 years    Types: Cigarettes    Quit date: 03/21/1989  . Smokeless tobacco: Never Used  . Alcohol Use: Yes     Comment: occasional glass of wine or mixed drink    FAMILY HISTORY:   Family History  Problem Relation Age of Onset  . Diabetes Mother   . Heart failure Mother   . Breast cancer Mother     DRUG ALLERGIES:   Allergies  Allergen Reactions  . Other Other (See Comments)    Statins-hmg-coa reductase inhibitors give muscle aches    REVIEW OF SYSTEMS:  CONSTITUTIONAL: No fever, positive for weakness, positive for sweating and hot. No weight loss EYES: No blurred or double vision. Wears glasses EARS, NOSE, AND THROAT: No tinnitus or ear pain. No sore throat. RESPIRATORY: No cough, shortness of breath, wheezing or hemoptysis.  CARDIOVASCULAR: No chest pain, orthopnea, edema.  GASTROINTESTINAL: No nausea, vomiting. Cramping in her abdomen. Bright red blood per rectum GENITOURINARY: No dysuria, hematuria.  ENDOCRINE: No polyuria, nocturia,  HEMATOLOGY: Anemia in the past SKIN: No rash or lesion. MUSCULOSKELETAL: Muscle aches with statin  NEUROLOGIC: No tingling, numbness. Syncope today PSYCHIATRY: No anxiety or depression.   MEDICATIONS AT HOME:   Prior to Admission medications   Medication Sig Start Date End Date Taking? Authorizing Provider  Ascorbic Acid (VITAMIN C) 1000 MG tablet Take 1,000 mg by mouth daily.   Yes Historical Provider, MD  Calcium-Magnesium-Vitamin D 185-50-100 MG-MG-UNIT CAPS Take 2 tablets by mouth daily.   Yes Historical Provider, MD  Cholecalciferol (VITAMIN D-3) 1000 UNITS CAPS Take by mouth.   Yes Historical  Provider, MD  Multiple Vitamin (MULTIVITAMIN) tablet Take 1 tablet by mouth daily.   Yes Historical Provider, MD  omega-3 fish oil (MAXEPA) 1000 MG CAPS capsule Take 3 capsules by mouth daily.   Yes Historical Provider, MD  potassium chloride (K-DUR,KLOR-CON) 10 MEQ tablet Take 10 mEq by mouth daily.   Yes Historical Provider, MD  triamterene-hydrochlorothiazide (MAXZIDE) 75-50 MG tablet Take 1 tablet by mouth daily.   Yes Historical Provider, MD  vitamin B-12 (CYANOCOBALAMIN) 1000 MCG tablet Take 1,000 mcg by mouth daily.   Yes Historical Provider, MD  vitamin E 1000 UNIT capsule Take 1,000 Units by mouth daily.   Yes Historical Provider, MD      VITAL SIGNS:  Blood pressure 114/55, pulse 76, temperature 98.1 F (36.7 C), temperature source Oral, resp. rate 11, height  (1.803 m), weight 90.81 kg (200 lb 3.2 oz), SpO2 100 %.  PHYSICAL EXAMINATION:  GENERAL:  69 y.o.-year-old patient lying in the bed with no acute distress.  EYES: Pupils equal, round, reactive to light and accommodation. No scleral icterus. Extraocular muscles intact. Conjunctiva pale HEENT: Head atraumatic, normocephalic. Oropharynx and nasopharynx clear.  NECK:  Supple, no jugular venous distention. No thyroid enlargement, no tenderness.  LUNGS: Normal breath sounds bilaterally, no wheezing, rales,rhonchi or crepitation. No use of accessory muscles of respiration.  CARDIOVASCULAR: S1, S2 irregularly irregular. No murmurs, rubs, or gallops.  ABDOMEN: Soft, nontender, nondistended. Bowel sounds present. No organomegaly or mass.  EXTREMITIES: Trace edema, no cyanosis, or clubbing.  NEUROLOGIC: Cranial nerves II through XII are intact. Muscle strength 5/5 in all extremities. Sensation intact. Gait not checked.  PSYCHIATRIC: The patient is alert and oriented x 3.  SKIN: No rash, lesion, or ulcer.   LABORATORY PANEL:   CBC  Recent Labs Lab 07/12/15 0741  WBC 12.1*  HGB 8.5*  HCT 25.0*  PLT 180    ------------------------------------------------------------------------------------------------------------------  Chemistries   Recent Labs Lab 07/12/15 0741  NA 142  K 2.8*  CL 111  CO2 25  GLUCOSE 156*  BUN 12  CREATININE 0.83  CALCIUM 7.9*  AST 15  ALT 11*  ALKPHOS 34*  BILITOT 0.8   ------------------------------------------------------------------------------------------------------------------  Cardiac Enzymes  Recent Labs Lab 07/12/15 0741  TROPONINI <0.03   ------------------------------------------------------------------------------------------------------------------   EKG:   Atrial fibrillation 79 bpm  IMPRESSION AND PLAN:   1. Acute hemorrhagic anemia, likely complication from polypectomy done yesterday with colonoscopy. We'll consult GI. ER physician ordered a transfusion of blood. Benefits and risks of transfusion explained. Admit to the CCU stepdown. If any further hemorrhaging can order a bleeding scan. Serial hemoglobins. 2. Initial hypotension and now tachycardia- watch closely in the CCU stepdown. Hold antihypertensives medications and give fluid support. 3. Severe hypokalemia- replace potassium orally and in IV fluids.Hydrochlorothiazide probably not a good medication for this patient. Check a magnesium and give IV magnesium. 4. Atrial fibrillation likely secondary to hypovolemia and  electrolyte abnormalities. Hopefully we'll convert to normal sinus rhythm once electrolytes are replaced and volume status improved.  All the records are reviewed and case discussed with ED provider. Management plans discussed with the patient, family and they are in agreement.  CODE STATUS: Full code  TOTAL TIME TAKING CARE OF THIS PATIENT: 55 minutes, patient will be monitored closely in the CCU at this point with initial hypotension and tachycardia.Renae Gloss, Duke Salvia.D on 07/12/2015 at 9:37 AM  Between 7am to 6pm - Pager - [phone]  After 6pm  call admission pager [phone]  [facility]  Office  [phone]  CC: Primary care physician; Sula Rumple, MD

## 2015-07-12 NOTE — ED Notes (Signed)
Pt states she had colonoscopy yesterday and had polyps removed. Pt states she woke up around 2 AM with rectal bleeding. Pt states she felt gushes of blood and became lightheaded this morning.

## 2015-07-13 LAB — CBC
HEMATOCRIT: 23.6 % — AB (ref 35.0–47.0)
Hemoglobin: 8.3 g/dL — ABNORMAL LOW (ref 12.0–16.0)
MCH: 30.6 pg (ref 26.0–34.0)
MCHC: 34.9 g/dL (ref 32.0–36.0)
MCV: 87.7 fL (ref 80.0–100.0)
PLATELETS: 161 10*3/uL (ref 150–440)
RBC: 2.7 MIL/uL — ABNORMAL LOW (ref 3.80–5.20)
RDW: 13.5 % (ref 11.5–14.5)
WBC: 6.8 10*3/uL (ref 3.6–11.0)

## 2015-07-13 LAB — BASIC METABOLIC PANEL
Anion gap: 4 — ABNORMAL LOW (ref 5–15)
BUN: 5 mg/dL — AB (ref 6–20)
CHLORIDE: 113 mmol/L — AB (ref 101–111)
CO2: 25 mmol/L (ref 22–32)
CREATININE: 0.69 mg/dL (ref 0.44–1.00)
Calcium: 8 mg/dL — ABNORMAL LOW (ref 8.9–10.3)
GFR calc Af Amer: >60 mL/min (ref >60)
GFR calc non Af Amer: >60 mL/min (ref >60)
GLUCOSE: 104 mg/dL — AB (ref 65–99)
POTASSIUM: 3.6 mmol/L (ref 3.5–5.1)
Sodium: 142 mmol/L (ref 135–145)

## 2015-07-13 LAB — TYPE AND SCREEN
ABO/RH(D): O POS
Antibody Screen: NEGATIVE
UNIT DIVISION: 0
UNIT DIVISION: 0
Unit division: 0

## 2015-07-13 LAB — HEMOGLOBIN
Hemoglobin: 8.3 g/dL — ABNORMAL LOW (ref 12.0–16.0)
Hemoglobin: 8.3 g/dL — ABNORMAL LOW (ref 12.0–16.0)

## 2015-07-13 LAB — SURGICAL PATHOLOGY

## 2015-07-13 LAB — POTASSIUM: POTASSIUM: 3.5 mmol/L (ref 3.5–5.1)

## 2015-07-13 LAB — MAGNESIUM: Magnesium: 1.9 mg/dL (ref 1.7–2.4)

## 2015-07-13 NOTE — Care Management (Signed)
Admitted to this facility following a Colonscopy with the diagnosis of acute post-hemorrhage. Lives with Uncle. Dawn Santana [phone]) and Dawn Santana [phone]). Last seen Dr. Elmer Ramp about 1.5 months ago. No home health. No skilled facility. Uses no aids for ambulation. Takes care of all basic and instrumental activities of daily living herself, drives. Family will transport. Gwenette Greet RN MSN Care Management [phone]

## 2015-07-13 NOTE — Progress Notes (Signed)
Pt had 1 bm, loose with medium amount of blood, but it was mixed with urine so amount of blood was unclear. Stool was bloody and watery.

## 2015-07-13 NOTE — Progress Notes (Signed)
Patient ID: Dawn Santana, female   DOB: 05/02/1946, 69 y.o.   MRN: [id number] [facility] PROGRESS NOTE  PCP: Sula Rumple, MD  HPI/Subjective: Patient had rectal bleeding this morning with urine. No solid bowel movement. Hard to tell how much blood as in there because it was mixed with the urine. Patient does not feel like she is going to pass out at this point.  Objective: Filed Vitals:   07/13/15 0547  BP: 107/55  Pulse: 67  Temp: 98.8 F (37.1 C)  Resp: 17    Filed Weights   07/12/15 0752 07/12/15 1137  Weight: 90.81 kg (200 lb 3.2 oz) 91.7 kg (202 lb 2.6 oz)    ROS: Review of Systems  Constitutional: Negative for fever and chills.  Eyes: Negative for blurred vision.  Respiratory: Negative for cough and shortness of breath.   Cardiovascular: Negative for chest pain.  Gastrointestinal: Positive for diarrhea and blood in stool. Negative for nausea, vomiting, abdominal pain and constipation.  Genitourinary: Negative for dysuria.  Musculoskeletal: Negative for joint pain.  Neurological: Positive for weakness. Negative for dizziness and headaches.   Exam: Physical Exam  Constitutional: She is oriented to person, place, and time.  HENT:  Nose: No mucosal edema.  Mouth/Throat: No oropharyngeal exudate or posterior oropharyngeal edema.  Eyes: Conjunctivae, EOM and lids are normal. Pupils are equal, round, and reactive to light.  Neck: No JVD present. Carotid bruit is not present. No edema present. No thyroid mass and no thyromegaly present.  Cardiovascular: S1 normal and S2 normal.  Exam reveals no gallop.   No murmur heard. Pulses:      Dorsalis pedis pulses are 2+ on the right side, and 2+ on the left side.  Respiratory: No respiratory distress. She has no wheezes. She has no rhonchi. She has no rales.  GI: Soft. Bowel sounds are normal. There is no tenderness.  Musculoskeletal:       Right ankle: She exhibits swelling.       Left ankle: She  exhibits swelling.  Lymphadenopathy:    She has no cervical adenopathy.  Neurological: She is alert and oriented to person, place, and time. No cranial nerve deficit.  Skin: Skin is warm. No rash noted. Nails show no clubbing.  Psychiatric: She has a normal mood and affect.    Data Reviewed: Basic Metabolic Panel:  Recent Labs Lab 07/12/15 0741 07/12/15 1705  NA 142  --   K 2.8* 3.4*  CL 111  --   CO2 25  --   GLUCOSE 156*  --   BUN 12  --   CREATININE 0.83  --   CALCIUM 7.9*  --   MG 1.6*  --    Liver Function Tests:  Recent Labs Lab 07/12/15 0741  AST 15  ALT 11*  ALKPHOS 34*  BILITOT 0.8  PROT 4.9*  ALBUMIN 3.0*   CBC:  Recent Labs Lab 07/12/15 0741 07/12/15 1705 07/13/15 0144 07/13/15 0528  WBC 12.1*  --   --  6.8  NEUTROABS 9.5*  --   --   --   HGB 8.5* 8.9* 8.3* 8.3*  HCT 25.0* 25.6*  --  23.6*  MCV 88.7  --   --  87.7  PLT 180  --   --  161     Recent Results (from the past 240 hour(s))  MRSA PCR Screening     Status: None   Collection Time: 07/12/15 11:40 AM  Result Value Ref Range Status  MRSA by PCR NEGATIVE NEGATIVE Final    Comment:        The GeneXpert MRSA Assay (FDA approved for NASAL specimens only), is one component of a comprehensive MRSA colonization surveillance program. It is not intended to diagnose MRSA infection nor to guide or monitor treatment for MRSA infections.       Scheduled Meds: . Influenza vac split quadrivalent PF  0.5 mL Intramuscular Tomorrow-1000  . pneumococcal 23 valent vaccine  0.5 mL Intramuscular Tomorrow-1000  . sodium chloride  3 mL Intravenous Q12H   Continuous Infusions: . 0.9 % NaCl with KCl 20 mEq / L 75 mL/hr at 07/13/15 0302    Assessment/Plan:  1. Acute hemorrhagic anemia. Lower GI bleed after polypectomy. Since the patient had another bleeding episode this morning, I will make nothing by mouth. I spoke with Dr. Bluford Kaufmann gastroenterology, who will come see the patient today and assess if  he wants to do another colonoscopy or not. Serial hemoglobins. Transfuse if needed. 2. Hypokalemia and hypomagnesemia- hydrochlorothiazide is not a good medication for this patient. Replace potassium in IV fluids.  3. Transient atrial fibrillation- resolved after fluid resuscitation and correction of electrolytes 4. Hypotension with history of hypertension- hydrochlorothiazide is not a good medication for this patient. Blood pressure currently on the lower side.  Code Status:     Code Status Orders        Start     Ordered   07/12/15 0929  Full code   Continuous     07/12/15 0930     Family Communication: family at bedside  Disposition Plan: home soon once bleeding subsides.  Consultants:  Gastroenterology  Time spent: 25 minutes.  Alford Highland  [facility]

## 2015-07-13 NOTE — Consult Note (Signed)
  GI Inpatient Follow-up Note  Patient Identification: Dawn Santana is a 69 y.o. female with post polypectomy bleeding.   Subjective: No bleeding yesterday. Small amount of bleeding this AM. Sounds like old blood. BP ok. NO longer feels lightheaded or dizzy. Hgb stable now.  Scheduled Inpatient Medications:  . Influenza vac split quadrivalent PF  0.5 mL Intramuscular Tomorrow-1000  . pneumococcal 23 valent vaccine  0.5 mL Intramuscular Tomorrow-1000  . sodium chloride  3 mL Intravenous Q12H    Continuous Inpatient Infusions:   . 0.9 % NaCl with KCl 20 mEq / L 75 mL/hr at 07/13/15 0302    PRN Inpatient Medications:  acetaminophen **OR** acetaminophen  Review of Systems: Constitutional: Weight is stable.  Eyes: No changes in vision. ENT: No oral lesions, sore throat.  GI: see HPI.  Heme/Lymph: No easy bruising.  CV: No chest pain.  GU: No hematuria.  Integumentary: No rashes.  Neuro: No headaches.  Psych: No depression/anxiety.  Endocrine: No heat/cold intolerance.  Allergic/Immunologic: No urticaria.  Resp: No cough, SOB.  Musculoskeletal: No joint swelling.    Physical Examination: BP 107/55 mmHg  Pulse 67  Temp(Src) 98.8 F (37.1 C) (Oral)  Resp 17  Ht  (1.803 m)  Wt 91.7 kg (202 lb 2.6 oz)  BMI 28.21 kg/m2  SpO2 100% Gen: NAD, alert and oriented x 4 HEENT: PEERLA, EOMI, Neck: supple, no JVD or thyromegaly Chest: CTA bilaterally, no wheezes, crackles, or other adventitious sounds CV: RRR, no m/g/c/r Abd: soft, NT, ND, +BS in all four quadrants; no HSM, guarding, ridigity, or rebound tenderness Ext: no edema, well perfused with 2+ pulses, Skin: no rash or lesions noted Lymph: no LAD  Data: Lab Results  Component Value Date   WBC 6.8 07/13/2015   HGB 8.3* 07/13/2015   HCT 23.6* 07/13/2015   MCV 87.7 07/13/2015   PLT 161 07/13/2015    Recent Labs Lab 07/12/15 1705 07/13/15 0144 07/13/15 0528  HGB 8.9* 8.3* 8.3*   Lab Results   Component Value Date   NA 142 07/13/2015   K 3.6 07/13/2015   CL 113* 07/13/2015   CO2 25 07/13/2015   BUN 5* 07/13/2015   CREATININE 0.69 07/13/2015   Lab Results  Component Value Date   ALT 11* 07/12/2015   AST 15 07/12/2015   ALKPHOS 34* 07/12/2015   BILITOT 0.8 07/12/2015    Recent Labs Lab 07/12/15 0741  INR 1.18   Assessment/Plan: Dawn Santana is a 69 y.o. female with LGI bleeding. Stable.  Recommendations: Because she had full liquid diet this AM, cannot schedule colonoscopy today. Clear liquid diet rest of today. Hopefully, no further bleeding. Dr. Mechele Collin will check on pt tomorrow. Thanks. Please call with questions or concerns.  Guerino Caporale, Ezzard Standing, MD

## 2015-07-13 NOTE — Care Management Important Message (Signed)
Important Message  Patient Details  Name: Dawn Santana MRN: [id number] Date of Birth: May 16, 1946   Medicare Important Message Given:  Yes-second notification given    Gwenette Greet, RN 07/13/2015, 9:55 AM

## 2015-07-13 NOTE — Progress Notes (Signed)
   07/13/15 1050  Clinical Encounter Type  Visited With Patient and family together  Visit Type Initial  Provided pastoral presence and support to patient and family/friends present in room.  [employer] Averyana Pillars-pager [phone]

## 2015-07-13 NOTE — Plan of Care (Signed)
Problem: Discharge Progression Outcomes Goal: Other Discharge Outcomes/Goals Outcome: Progressing Pt alert and oriented, had 1 bm with blood in it, going to observe through the night and if hemoglobin remains stable she will go home tomorrow. Pt on clear liquid diet and Dr Markham Jordan will see pt tomorrow and review labs before discharge

## 2015-07-13 NOTE — Plan of Care (Signed)
Problem: Discharge Progression Outcomes Goal: Other Discharge Outcomes/Goals Plan of care progress to goal: - No complaints of pain. - No active bleed this shift. - Assist to [facility]. - Continues IV fluids. Will continue to monitor.

## 2015-07-14 LAB — CBC
HCT: 23 % — ABNORMAL LOW (ref 35.0–47.0)
HEMATOCRIT: 25.1 % — AB (ref 35.0–47.0)
HEMOGLOBIN: 7.9 g/dL — AB (ref 12.0–16.0)
Hemoglobin: 8.7 g/dL — ABNORMAL LOW (ref 12.0–16.0)
MCH: 30.2 pg (ref 26.0–34.0)
MCH: 30.6 pg (ref 26.0–34.0)
MCHC: 34.5 g/dL (ref 32.0–36.0)
MCHC: 34.6 g/dL (ref 32.0–36.0)
MCV: 87.6 fL (ref 80.0–100.0)
MCV: 88.5 fL (ref 80.0–100.0)
PLATELETS: 159 10*3/uL (ref 150–440)
PLATELETS: 189 10*3/uL (ref 150–440)
RBC: 2.62 MIL/uL — AB (ref 3.80–5.20)
RBC: 2.84 MIL/uL — AB (ref 3.80–5.20)
RDW: 13.5 % (ref 11.5–14.5)
RDW: 13.9 % (ref 11.5–14.5)
WBC: 5.8 10*3/uL (ref 3.6–11.0)
WBC: 7.1 10*3/uL (ref 3.6–11.0)

## 2015-07-14 LAB — POTASSIUM: POTASSIUM: 3.3 mmol/L — AB (ref 3.5–5.1)

## 2015-07-14 MED ORDER — FERROUS SULFATE 325 (65 FE) MG PO TABS
325.0000 mg | ORAL_TABLET | Freq: Three times a day (TID) | ORAL | Status: DC
  Administered 2015-07-14: 13:00:00 325 mg via ORAL
  Filled 2015-07-14: qty 1

## 2015-07-14 MED ORDER — ADULT MULTIVITAMIN W/MINERALS CH: 1.0000 | ORAL_TABLET | Freq: Every day | ORAL | Status: DC

## 2015-07-14 MED ORDER — POTASSIUM CHLORIDE 20 MEQ/15ML (10%) PO SOLN
40.0000 meq | Freq: Once | ORAL | Status: AC
  Administered 2015-07-14: 40 meq via ORAL
  Filled 2015-07-14: qty 30

## 2015-07-14 MED ORDER — FERROUS SULFATE 325 (65 FE) MG PO TABS: 325.0000 mg | ORAL_TABLET | Freq: Three times a day (TID) | ORAL | Status: AC

## 2015-07-14 NOTE — Discharge Instructions (Signed)
Low sodium and heart healthy diet. °Activity as tolerated. °

## 2015-07-14 NOTE — Discharge Summary (Signed)
[facility] - [place] at [facility]   PATIENT NAME: Dawn Santana    MR#:  [id number]  DATE OF BIRTH:  11-Apr-1946  DATE OF ADMISSION:  07/12/2015 ADMITTING PHYSICIAN: Alford Highland, MD  DATE OF DISCHARGE: 07/14/2015 PRIMARY CARE PHYSICIAN: Sula Rumple, MD    ADMISSION DIAGNOSIS:  Hypokalemia [E87.6] Acute lower GI bleeding [K92.2] Anemia due to other cause [D64.89] Atrial fibrillation, unspecified type (HCC) [I48.91]   DISCHARGE DIAGNOSIS:    SECONDARY DIAGNOSIS:   Past Medical History  Diagnosis Date  . Hyperlipidemia   . Rectal bleeding   . Constipation     occasional  . Hypertension   . PONV (postoperative nausea and vomiting)   . Arthritis     knees and joints    HOSPITAL COURSE:   1. Acute hemorrhagic anemia. Lower GI bleed after polypectomy. No active bleeding today. Hemoglobin is 8.7. No colonoscopy this time and can be discharged to home per Dr. Markham Jordan. 2. Hypokalemia and hypomagnesemia- hydrochlorothiazide is not a good medication for this patient. Replaced potassium. Hold Maxzide. 3. Transient atrial fibrillation- resolved after fluid resuscitation and correction of electrolytes 4. Hypotension with history of hypertension- hydrochlorothiazide is not a good medication for this patient. Blood pressure is normal.  DISCHARGE CONDITIONS:  Stable, discharge home today.  CONSULTS OBTAINED:  Treatment Team:  Shaune Pollack, MD  DRUG ALLERGIES:   Allergies  Allergen Reactions  . Other Other (See Comments)    Statins-hmg-coa reductase inhibitors give muscle aches    DISCHARGE MEDICATIONS:   Current Discharge Medication List    START taking these medications   Details  ferrous sulfate 325 (65 FE) MG tablet Take 1 tablet (325 mg total) by mouth 3 (three) times daily with meals. Qty: 90 tablet, Refills: 0      CONTINUE these medications which have NOT CHANGED   Details  Ascorbic Acid (VITAMIN C) 1000 MG tablet Take 1,000 mg  by mouth daily.    Calcium-Magnesium-Vitamin D 185-50-100 MG-MG-UNIT CAPS Take 2 tablets by mouth daily.    Cholecalciferol (VITAMIN D-3) 1000 UNITS CAPS Take by mouth.    Multiple Vitamin (MULTIVITAMIN) tablet Take 1 tablet by mouth daily.    omega-3 fish oil (MAXEPA) 1000 MG CAPS capsule Take 3 capsules by mouth daily.    potassium chloride (K-DUR,KLOR-CON) 10 MEQ tablet Take 10 mEq by mouth daily.    vitamin B-12 (CYANOCOBALAMIN) 1000 MCG tablet Take 1,000 mcg by mouth daily.    vitamin E 1000 UNIT capsule Take 1,000 Units by mouth daily.      STOP taking these medications     triamterene-hydrochlorothiazide (MAXZIDE) 75-50 MG tablet          DISCHARGE INSTRUCTIONS:    If you experience worsening of your admission symptoms, develop shortness of breath, life threatening emergency, suicidal or homicidal thoughts you must seek medical attention immediately by calling 911 or calling your MD immediately  if symptoms less severe.  You Must read complete instructions/literature along with all the possible adverse reactions/side effects for all the Medicines you take and that have been prescribed to you. Take any new Medicines after you have completely understood and accept all the possible adverse reactions/side effects.   Please note  You were cared for by a hospitalist during your hospital stay. If you have any questions about your discharge medications or the care you received while you were in the hospital after you are discharged, you can call the unit and asked to speak with the hospitalist  on call if the hospitalist that took care of you is not available. Once you are discharged, your primary care physician will handle any further medical issues. Please note that NO REFILLS for any discharge medications will be authorized once you are discharged, as it is imperative that you return to your primary care physician (or establish a relationship with a primary care physician if you do  not have one) for your aftercare needs so that they can reassess your need for medications and monitor your lab values.    Today   SUBJECTIVE   No complaint.   VITAL SIGNS:  Blood pressure 133/59, pulse 93, temperature 98.8 F (37.1 C), temperature source Oral, resp. rate 18, height  (1.803 m), weight 91.7 kg (202 lb 2.6 oz), SpO2 100 %.  I/O:   Intake/Output Summary (Last 24 hours) at 07/14/15 1333 Last data filed at 07/14/15 1317  Gross per 24 hour  Intake   1870 ml  Output   1602 ml  Net    268 ml    PHYSICAL EXAMINATION:  GENERAL:  69 y.o.-year-old patient lying in the bed with no acute distress.  EYES: Pupils equal, round, reactive to light and accommodation. No scleral icterus. Extraocular muscles intact.  HEENT: Head atraumatic, normocephalic. Oropharynx and nasopharynx clear. Moist oral mucosa. NECK:  Supple, no jugular venous distention. No thyroid enlargement, no tenderness.  LUNGS: Normal breath sounds bilaterally, no wheezing, rales,rhonchi or crepitation. No use of accessory muscles of respiration.  CARDIOVASCULAR: S1, S2 normal. No murmurs, rubs, or gallops.  ABDOMEN: Soft, non-tender, non-distended. Bowel sounds present. No organomegaly or mass.  EXTREMITIES: No pedal edema, cyanosis, or clubbing.  NEUROLOGIC: Cranial nerves II through XII are intact. Muscle strength 5/5 in all extremities. Sensation intact. Gait not checked.  PSYCHIATRIC: The patient is alert and oriented x 3.  SKIN: No obvious rash, lesion, or ulcer.   DATA REVIEW:   CBC  Recent Labs Lab 07/14/15 1251  WBC 7.1  HGB 8.7*  HCT 25.1*  PLT 189    Chemistries   Recent Labs Lab 07/12/15 0741  07/13/15 0528  07/14/15 0501  NA 142  --  142  --   --   K 2.8*  < > 3.6  < > 3.3*  CL 111  --  113*  --   --   CO2 25  --  25  --   --   GLUCOSE 156*  --  104*  --   --   BUN 12  --  5*  --   --   CREATININE 0.83  --  0.69  --   --   CALCIUM 7.9*  --  8.0*  --   --   MG 1.6*  --   1.9  --   --   AST 15  --   --   --   --   ALT 11*  --   --   --   --   ALKPHOS 34*  --   --   --   --   BILITOT 0.8  --   --   --   --   < > = values in this interval not displayed.  Cardiac Enzymes  Recent Labs Lab 07/12/15 0741  TROPONINI <0.03    Microbiology Results  Results for orders placed or performed during the hospital encounter of 07/12/15  MRSA PCR Screening     Status: None   Collection Time: 07/12/15 11:40 AM  Result  Value Ref Range Status   MRSA by PCR NEGATIVE NEGATIVE Final    Comment:        The GeneXpert MRSA Assay (FDA approved for NASAL specimens only), is one component of a comprehensive MRSA colonization surveillance program. It is not intended to diagnose MRSA infection nor to guide or monitor treatment for MRSA infections.     RADIOLOGY:  No results found.    I discussed with Dr.Elliot.  Management plans discussed with the patient, her daughter and they are in agreement.  CODE STATUS:     Code Status Orders        Start     Ordered   07/12/15 0929  Full code   Continuous     07/12/15 0930      TOTAL TIME TAKING CARE OF THIS PATIENT: 35 minutes.    Shaune Pollack M.D on 07/14/2015 at 1:33 PM  Between 7am to 6pm - Pager - [phone]  After 6pm go to [email or web address] - password [email or web address] [facility]  [place] [facility]  Office  [phone]  CC: Primary care physician; Sula Rumple, MD

## 2015-07-14 NOTE — Consult Note (Signed)
Pt doing well with no further bleeding.  No abd pain.  Hgb 7.9.  I recommend repeat CBC at 2pm and if stable and no further bleeding the patient can go home and take it easy, avoid NSAID meds,  Wait 2 weeks and start iron pills and multivits.  To call for rebleed  ( unlikely event)

## 2015-07-14 NOTE — Progress Notes (Signed)
MD order received in [facility] to discharge pt home today; verbally reviewed AVS with pt including medications/gave Rx to Ferrous Sulfate to pt; no further questions voiced at this time; pt discharged via wheelchair by nursing to the visitor's entrance

## 2015-07-14 NOTE — Plan of Care (Signed)
Problem: Discharge Progression Outcomes Goal: Other Discharge Outcomes/Goals Outcome: Progressing Plan of care progress to goals: Discharge plan- pt is from home and would like to return to home when discharged. Stools guaiac negative- no stools this shift.  Hemodynamically stable- VSS, Hgb 8.3 continue to monitor labs.  Diet- tolerating, no c/o nausea or vomiting this shift. Activity appropriate for discharge- pt up to [facility] or BR with only stand by assist, steady gait.   A&O patient rested well this shift. No c/o pain or discomfort. VSS. Off unit telemetry NSR with 1st degree HB. Moderate fall risk. Possible discharge to home today.

## 2016-03-31 DIAGNOSIS — Z8719 Personal history of other diseases of the digestive system: Secondary | ICD-10-CM | POA: Insufficient documentation

## 2017-04-13 DIAGNOSIS — Z860100 Personal history of colon polyps, unspecified: Secondary | ICD-10-CM | POA: Insufficient documentation

## 2017-04-30 DIAGNOSIS — E559 Vitamin D deficiency, unspecified: Secondary | ICD-10-CM | POA: Insufficient documentation

## 2017-07-15 ENCOUNTER — Other Ambulatory Visit: Payer: Self-pay | Admitting: Family Medicine

## 2017-07-15 DIAGNOSIS — Z1231 Encounter for screening mammogram for malignant neoplasm of breast: Secondary | ICD-10-CM

## 2018-02-08 ENCOUNTER — Other Ambulatory Visit: Payer: Self-pay | Admitting: Family Medicine

## 2018-02-08 ENCOUNTER — Ambulatory Visit
Admission: RE | Admit: 2018-02-08 | Discharge: 2018-02-08 | Disposition: A | Discharge status: Home / Self Care | Payer: Medicare Other | Source: Ambulatory Visit | Attending: Family Medicine | Admitting: Family Medicine

## 2018-02-08 DIAGNOSIS — N6341 Unspecified lump in right breast, subareolar: Secondary | ICD-10-CM | POA: Diagnosis not present

## 2018-02-08 DIAGNOSIS — Z1231 Encounter for screening mammogram for malignant neoplasm of breast: Secondary | ICD-10-CM

## 2018-02-08 DIAGNOSIS — N6320 Unspecified lump in the left breast, unspecified quadrant: Secondary | ICD-10-CM | POA: Diagnosis present

## 2018-02-09 ENCOUNTER — Other Ambulatory Visit: Payer: Self-pay | Admitting: Family Medicine

## 2018-02-09 DIAGNOSIS — N6310 Unspecified lump in the right breast, unspecified quadrant: Secondary | ICD-10-CM

## 2018-02-09 DIAGNOSIS — R928 Other abnormal and inconclusive findings on diagnostic imaging of breast: Secondary | ICD-10-CM

## 2018-02-19 ENCOUNTER — Ambulatory Visit
Admission: RE | Admit: 2018-02-19 | Discharge: 2018-02-19 | Disposition: A | Discharge status: Home / Self Care | Payer: Medicare Other | Source: Ambulatory Visit | Attending: Family Medicine | Admitting: Family Medicine

## 2018-02-19 DIAGNOSIS — R928 Other abnormal and inconclusive findings on diagnostic imaging of breast: Secondary | ICD-10-CM

## 2018-02-19 DIAGNOSIS — N6310 Unspecified lump in the right breast, unspecified quadrant: Secondary | ICD-10-CM

## 2018-02-19 HISTORY — PX: BREAST BIOPSY: SHX20

## 2018-02-22 LAB — SURGICAL PATHOLOGY

## 2018-07-21 ENCOUNTER — Other Ambulatory Visit: Payer: Self-pay | Admitting: Nurse Practitioner

## 2018-07-21 DIAGNOSIS — N6310 Unspecified lump in the right breast, unspecified quadrant: Secondary | ICD-10-CM

## 2018-07-23 IMAGING — MG US  BREAST BX W/ LOC DEV 1ST LESION IMG BX SPEC US GUIDE*R*
1 series · 8 of 8 positions shown · non-contrast
Comparison: Previous exam(s).

ADDENDUM:
Pathology of the right breast biopsy revealed A. RIGHT BREAST,
RETROAREOLAR, [DATE]; ULTRASOUND-GUIDED BIOPSY: FIBROCYSTIC CHANGE.
MINUTE INTRADUCTAL PAPILLOMA (0.5 MM). PSEUDO-ANGIOMATOUS STROMAL
HYPERPLASIA.

This was found to be concordant by Dr. Kranthi.
Recommendation: Six month follow-up diagnostic mammogram of the
right breast for a separate asymmetry in the lower right breast.
At the patient's request, results and recommendations were relayed
to the patient by phone by Fatland, Lornts on 02/22/18. The patient
stated she did well following the biopsy with no bleeding, bruising,
or pain. Post biopsy instructions were reviewed with the patient and
all of her questions were answered. She was encouraged to contact
the [HOSPITAL] with any further questions or concerns.
Addendum by Fatland, Lornts on 02/23/18.
CLINICAL DATA: Right breast subareolar 12 o'clock nodule.
EXAM:
ULTRASOUND GUIDED RIGHT BREAST CORE NEEDLE BIOPSY

[Series 1: MG view · 0.05mm/px · 8 of 8 slices shown]
[im 1/8]
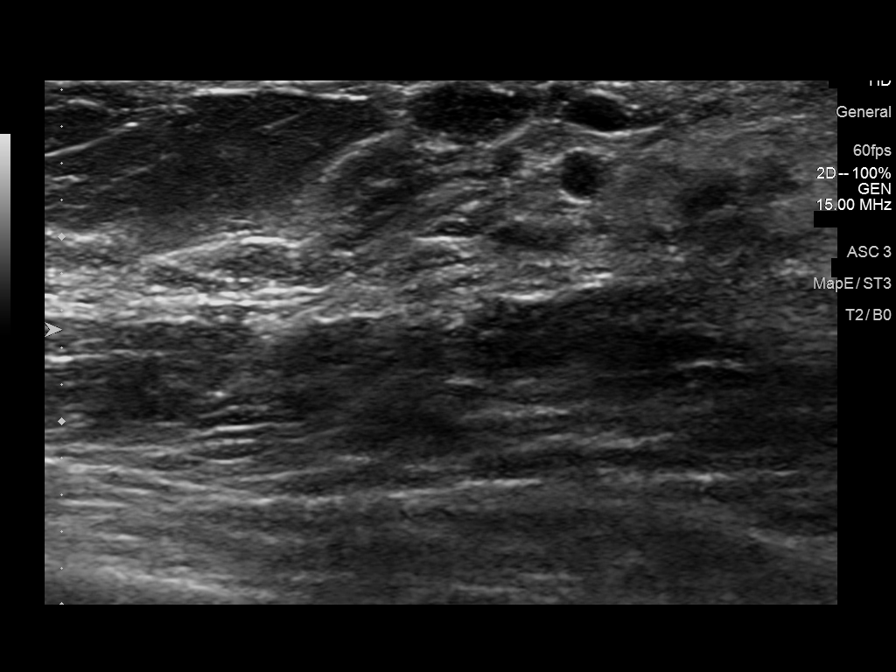
[im 2/8]
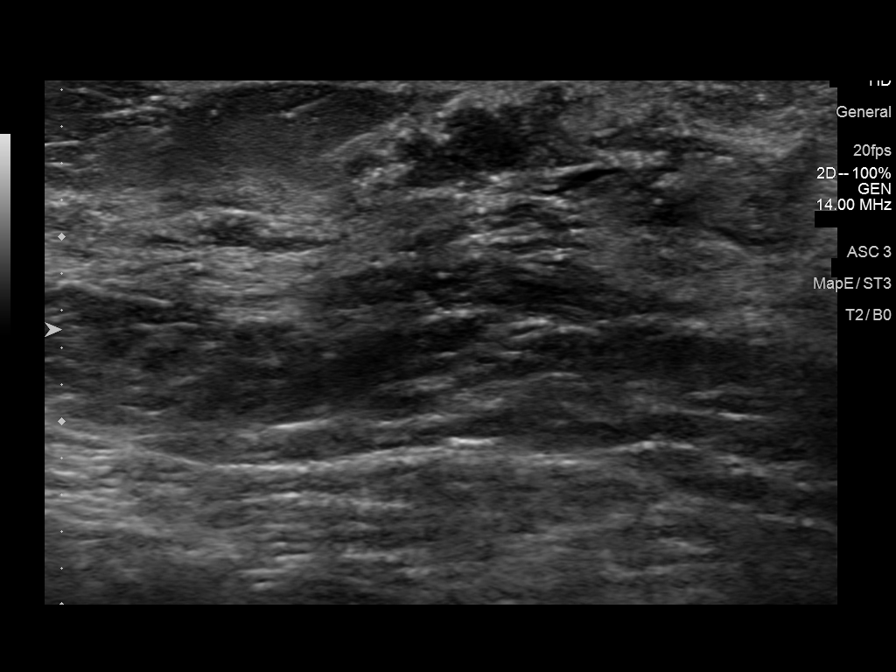
[im 3/8]
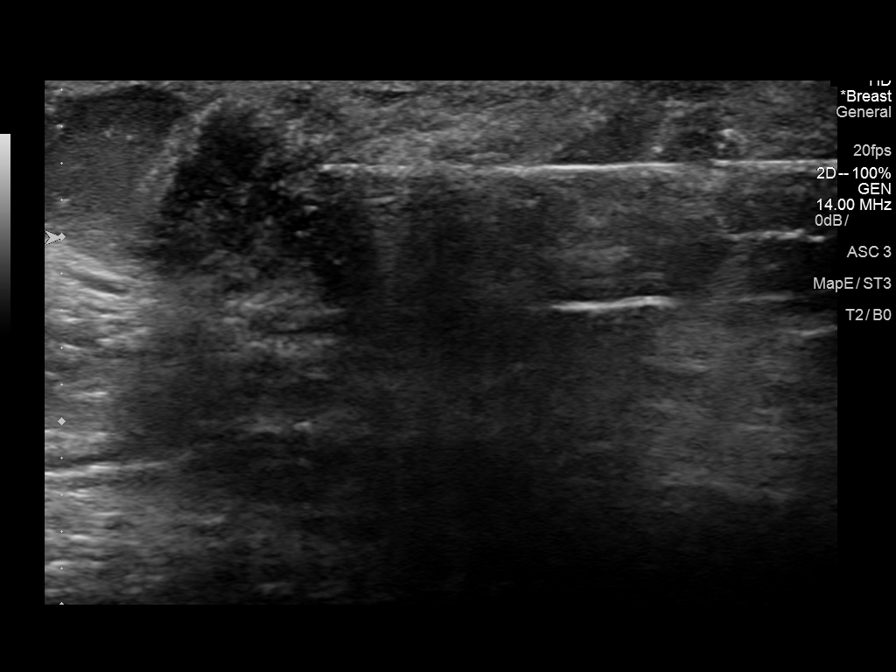
[im 4/8]
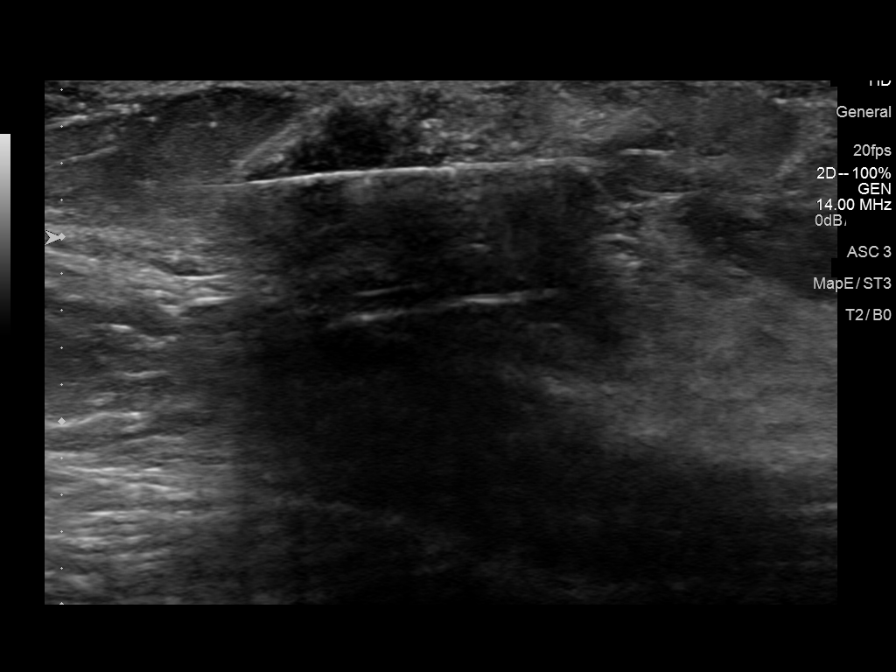
[im 5/8]
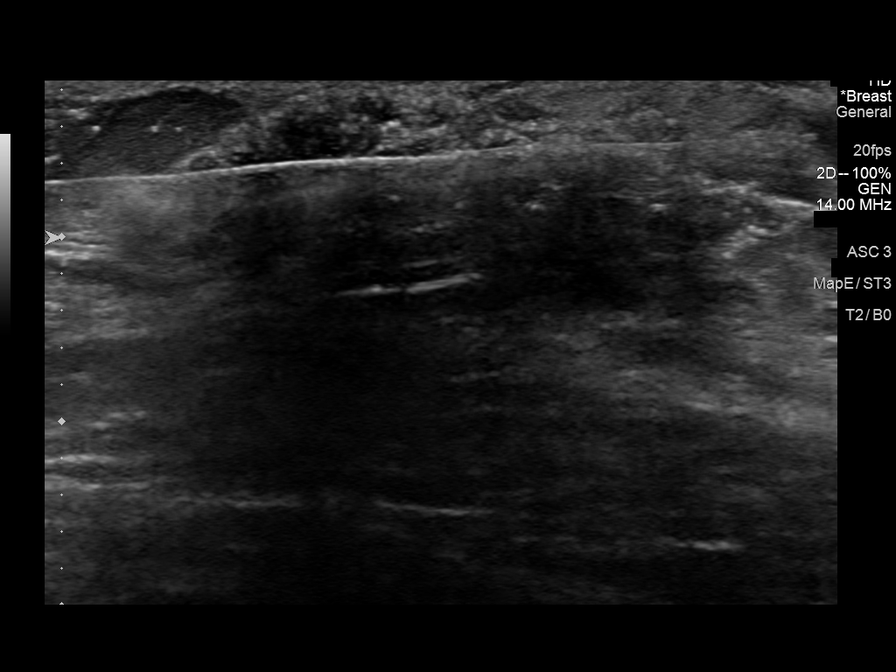
[im 6/8]
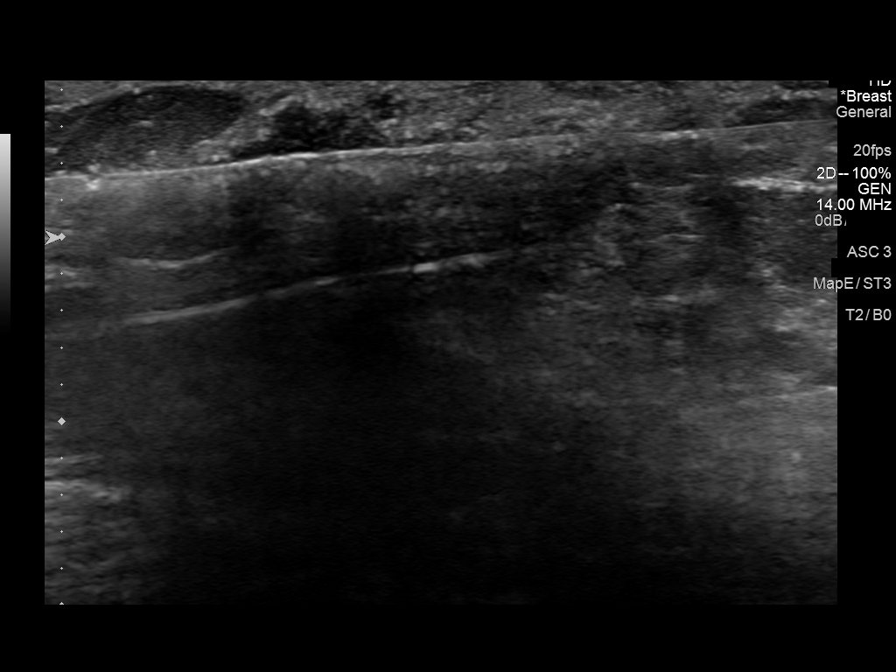
[im 7/8]
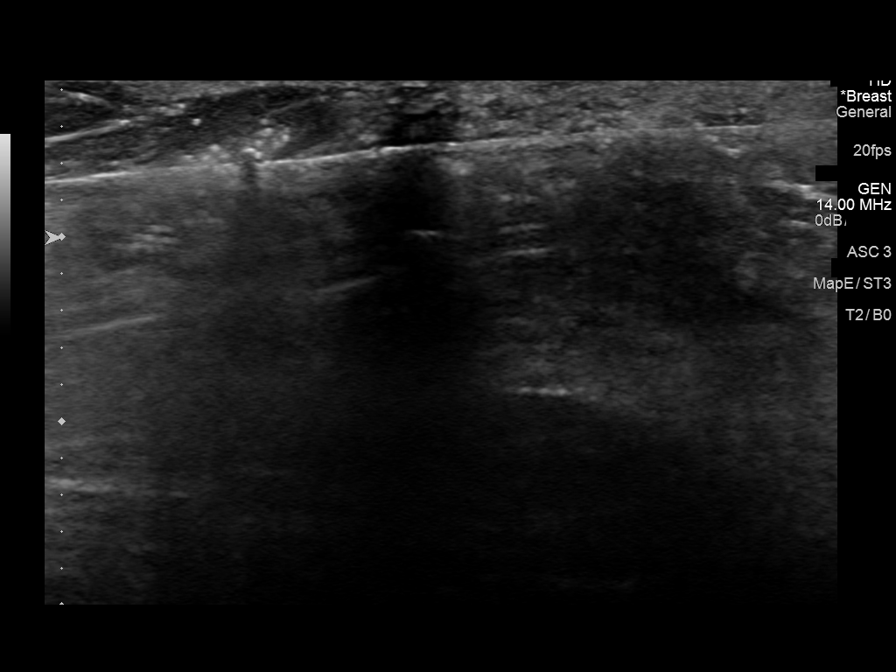
[im 8/8]
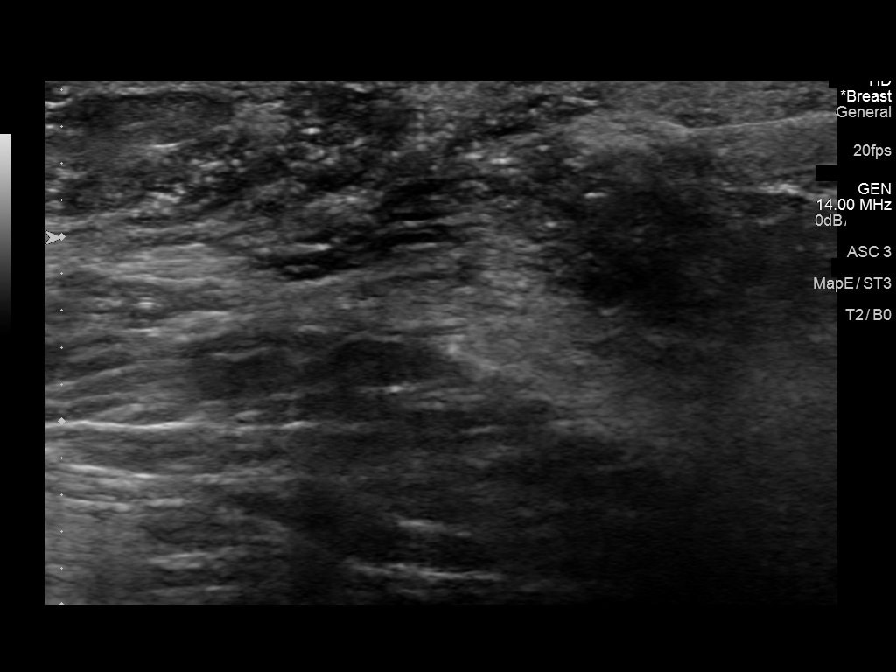

[8 of 8 positions shown; findings below may reference images not displayed]



Lesion quadrant: Upper outer quadrant

Using sterile technique and 1% Lidocaine as local anesthetic, under
direct ultrasound visualization, a 14 gauge Diggi device was
used to perform biopsy of right breast 12 o'clock subareolar nodule
using a medial approach. At the conclusion of the procedure a ribbon
shaped tissue marker clip was deployed into the biopsy cavity.
Follow up 2 view mammogram was performed and dictated separately.
IMPRESSION: Ultrasound guided biopsy of right breast. No apparent complications.

## 2018-09-13 ENCOUNTER — Other Ambulatory Visit: Payer: Self-pay | Admitting: Nurse Practitioner

## 2018-09-13 DIAGNOSIS — N6310 Unspecified lump in the right breast, unspecified quadrant: Secondary | ICD-10-CM

## 2019-09-11 IMAGING — US US BREAST*R* LIMITED INC AXILLA
1 series · 10 of 10 positions shown · non-contrast
Comparison: Previous exam(s).

ADDENDUM:
Additional Recommendation: After ultrasound-guided biopsy is
performed, recommend postprocedure mammogram to ensure sonographic
and mammographic correspondence. If ultrasound-guided biopsy
revealed a benign pathology result, and the clip placed at
ultrasound-guided biopsy did not correspond to the mammographic
finding, a six-month follow-up diagnostic mammogram would be
recommended to ensure stability.
CLINICAL DATA: Previous diagnostic mammogram report of 10/11/2014
described probably benign duct ectasia in the retroareolar LEFT
breast and/or scarring from remote surgery, for which a follow-up
diagnostic mammogram was recommended in 6 months.Patient returns
today for the follow-up diagnostic exam.

EXAM:
DIGITAL DIAGNOSTIC BILATERAL MAMMOGRAM WITH CAD AND TOMO
ULTRASOUND RIGHT BREAST

[Series 1: us breast*right* limited inc axilla · 0.06mm/px · 10 of 10 slices shown]
[im 1/10]
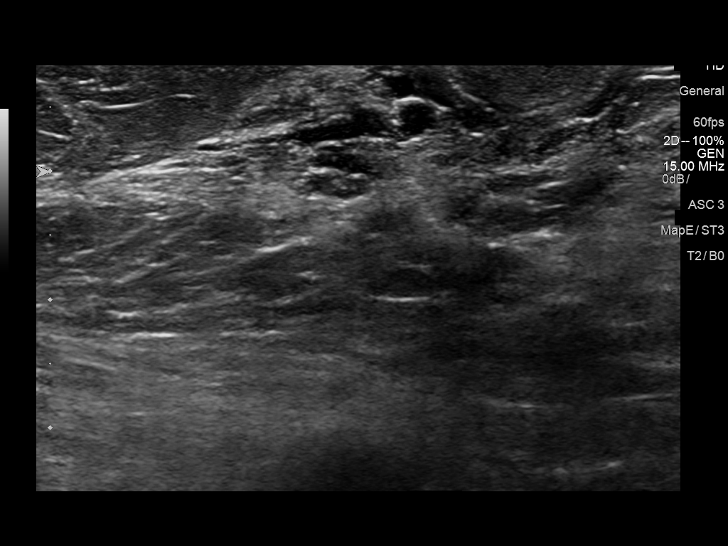
[im 2/10]
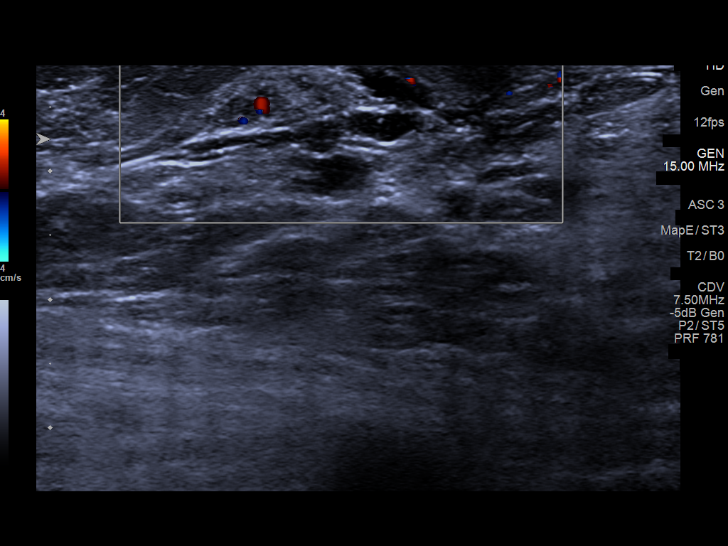
[im 3/10]
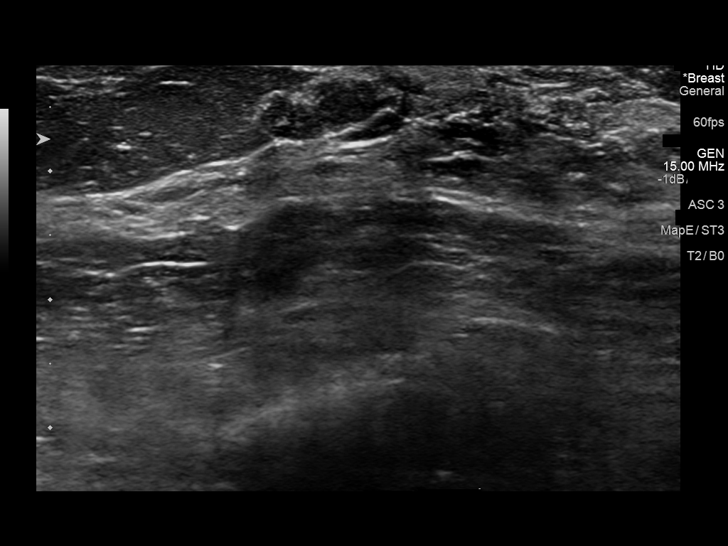
[im 4/10]
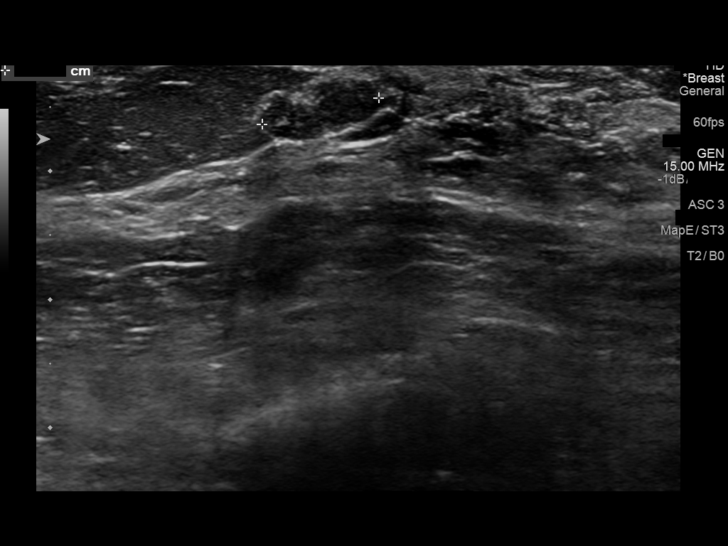
[im 5/10]
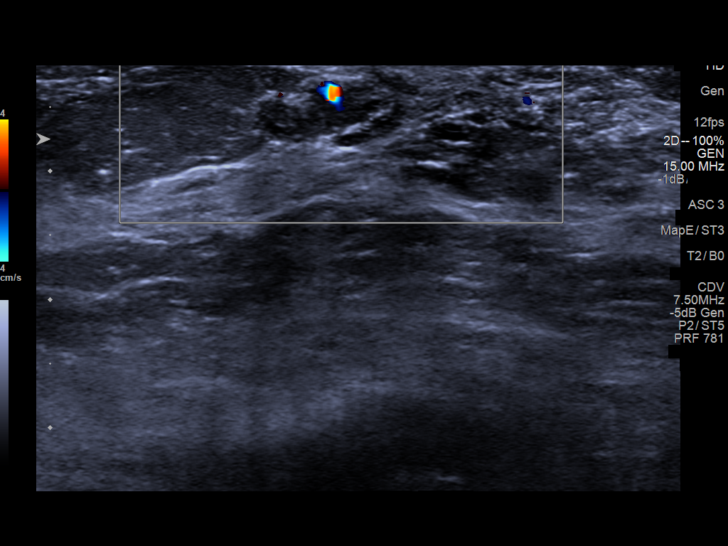
[im 6/10]
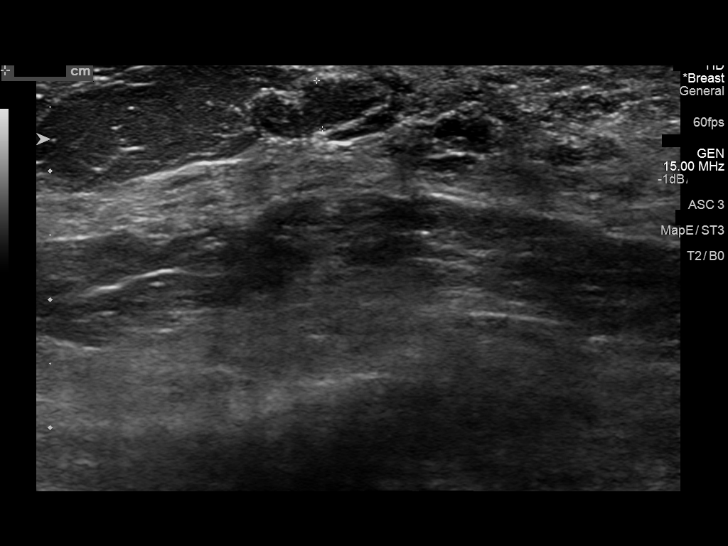
[im 7/10]
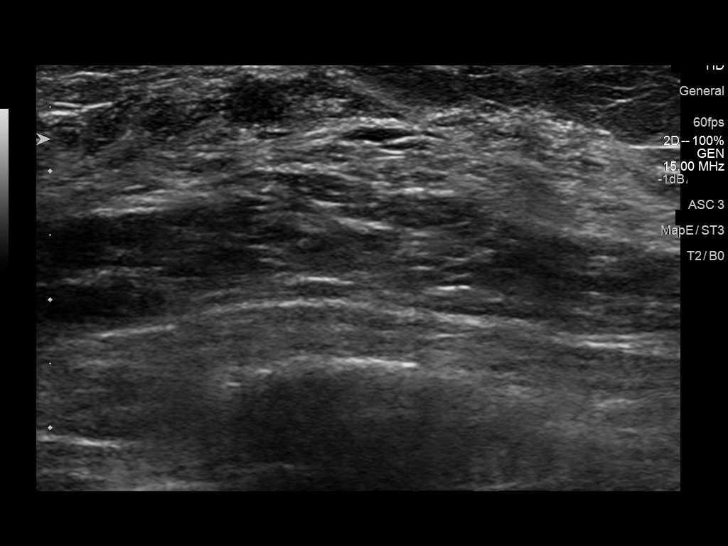
[im 8/10]
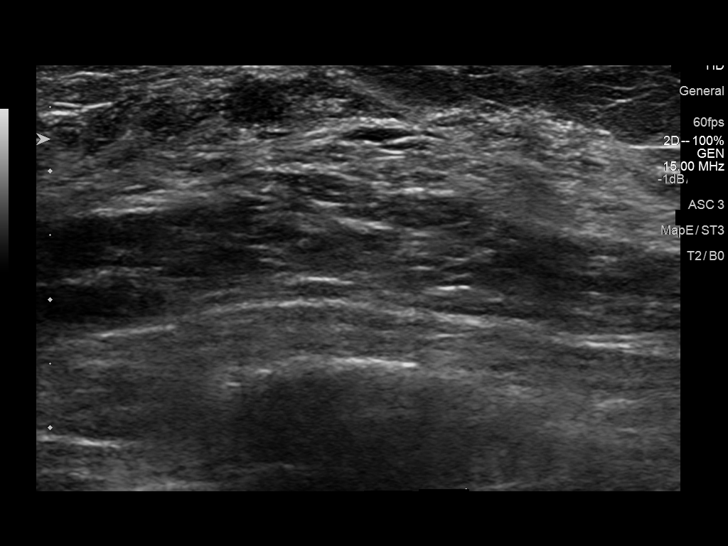
[im 9/10]
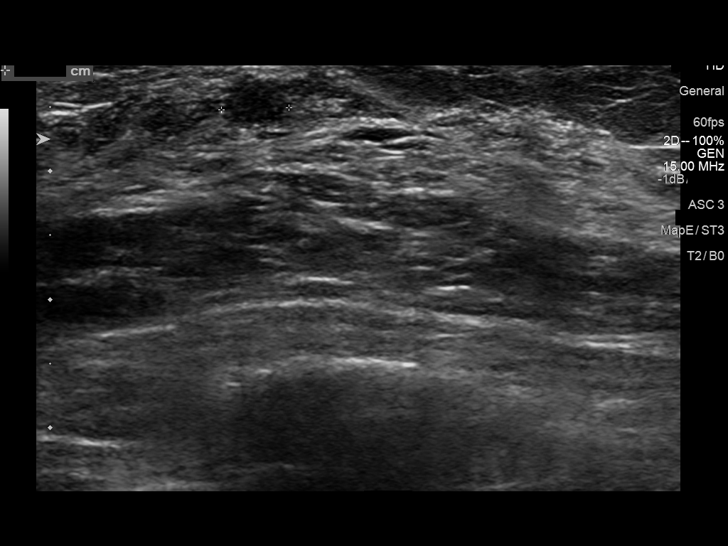
[im 10/10]
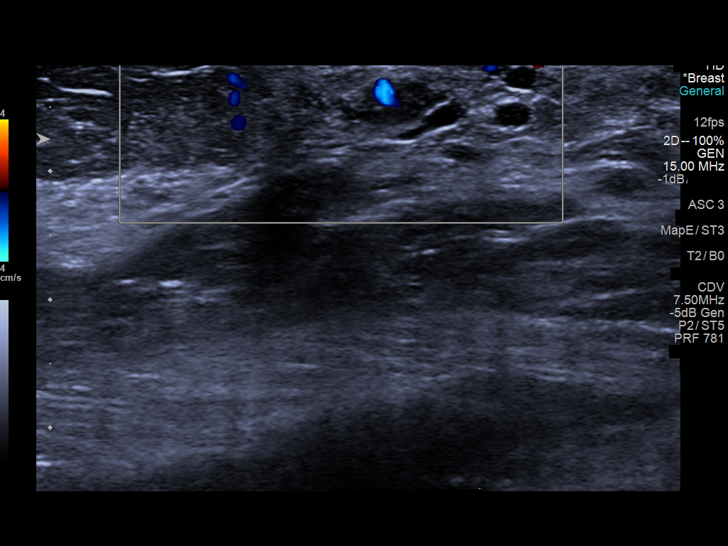

[10 of 10 positions shown; findings below may reference images not displayed]

ACR Breast Density Category c: The breast tissue is heterogeneously
dense, which may obscure small masses.
FINDINGS: LEFT breast: Fibroglandular pattern is stable. There are no new
dominant masses, suspicious calcifications or secondary signs of
malignancy within the LEFT breast.

RIGHT breast: There is an asymmetry within the inferior subareolar
RIGHT breast, 6 o'clock axis, anterior depth, seen on MLO views
only, persistent on spot compression views with 3D tomosynthesis.
There are no new dominant masses, suspicious calcifications or
secondary signs of malignancy elsewhere within the RIGHT breast.

Mammographic images were processed with CAD.

RIGHT breast: Targeted ultrasound is performed, showing an irregular
hypoechoic mass in the retroareolar RIGHT breast, 12 o'clock axis
(can appear to be at the 6 o'clock axis with slight changes in probe
angulation), at superficial depth, with internal vascularity, versus
intraductal mass with vascularity, measuring 9 x 4 x 5 mm, possible
correlate for the mammographic finding.

Multiple additional mildly distended retroareolar ducts are noted,
most prominent at the 6 o'clock axis, without internal mass or
vascularity indicating benignity, also possible correlate for the
mammographic finding.
IMPRESSION: 1. Irregular hypoechoic mass in the retroareolar RIGHT breast, 12
o'clock axis region (although can appear to be at the 6 o'clock axis
with difference in probe angulation), at superficial depth, with
internal vascularity, alternatively intraductal mass or vascularity,
measuring 9 mm. This is a suspicious finding for which
ultrasound-guided biopsy is recommended. This is a possible
correlate for the mammographic finding, although there are multiple
mildly distended retroareolar ducts without internal mass or
vascularity which may correspond to the mammographic finding.
2. No evidence of malignancy within the LEFT breast.

RECOMMENDATION:
Ultrasound-guided biopsy of the retroareolar RIGHT breast mass,
versus intraductal mass with vascularity, as detailed above.

Ordering physician will be contacted with today's results and
patient will then be scheduled for ultrasound-guided core biopsy at
her earliest convenience.

I have discussed the findings and recommendations with the patient.
Results were also provided in writing at the conclusion of the
visit. If applicable, a reminder letter will be sent to the patient
regarding the next appointment.

BI-RADS CATEGORY  4: Suspicious.

## 2020-01-30 DIAGNOSIS — M25561 Pain in right knee: Secondary | ICD-10-CM | POA: Insufficient documentation

## 2020-09-04 ENCOUNTER — Other Ambulatory Visit: Payer: Self-pay

## 2020-09-04 ENCOUNTER — Ambulatory Visit
Admission: EM | Admit: 2020-09-04 | Discharge: 2020-09-04 | Disposition: A | Discharge status: Home / Self Care | Payer: Medicare Other | Attending: Family Medicine | Admitting: Family Medicine

## 2020-09-04 DIAGNOSIS — M62838 Other muscle spasm: Secondary | ICD-10-CM | POA: Diagnosis not present

## 2020-09-04 MED ORDER — BACLOFEN 10 MG PO TABS: 10.0000 mg | ORAL_TABLET | Freq: Three times a day (TID) | ORAL | 0 refills | Status: AC | PRN

## 2020-09-04 NOTE — ED Triage Notes (Signed)
Patient states that she was decorating a garland a week ago and was hunched over. Patient states that ever since she has been having neck pain and arm pain radiating. States that pain has been constant and worse at night.

## 2020-09-04 NOTE — ED Provider Notes (Signed)
[facility]    CSN: [id number] Arrival date & time: 09/04/20  6759      History   Chief Complaint Chief Complaint  Patient presents with  . Neck Pain  . Arm Pain   HPI  74 year old female presents with the above complaints.  Patient reports that she has had ongoing symptoms of the past 10 days.  Started after she was decorating some garland.  Patient reports pain and tension of the left trapezius.  She has had some left-sided neck pain as well.  She is now experiencing pain around the left scapula.  She has tried over-the-counter Biofreeze and heat with improvement.  However, this recurs after she starts doing activity again.  She states that it radiates down her arm.  Pain currently 10/10 in severity.  No other associated symptoms.  No other complaints.  Past Medical History:  Diagnosis Date  . Arthritis    knees and joints  . Constipation    occasional  . Hyperlipidemia   . Hypertension   . PONV (postoperative nausea and vomiting)   . Rectal bleeding     Patient Active Problem List   Diagnosis Date Noted  . Acute post-hemorrhagic anemia 07/12/2015    Past Surgical History:  Procedure Laterality Date  . ABDOMINAL HYSTERECTOMY    . APPENDECTOMY    . BREAST BIOPSY Right 02/19/2018   [place] bx path pending  . BREAST EXCISIONAL BIOPSY Bilateral 1980's   neg  . COLONOSCOPY N/A 07/11/2015   Procedure: COLONOSCOPY;  Surgeon: Wallace Cullens, MD;  Location: [facility];  Service: Gastroenterology;  Laterality: N/A;  . POLYPECTOMY  07/11/2015   Procedure: POLYPECTOMY INTESTINAL;  Surgeon: Wallace Cullens, MD;  Location: [facility];  Service: Gastroenterology;;    OB History   No obstetric history on file.      Home Medications    Prior to Admission medications   Medication Sig Start Date End Date Taking? Authorizing Provider  Ascorbic Acid (VITAMIN C) 1000 MG tablet Take 1,000 mg by mouth daily.   Yes [provider]    Calcium-Magnesium-Vitamin D 185-50-100 MG-MG-UNIT CAPS Take 2 tablets by mouth daily.   Yes [provider]  Cholecalciferol (VITAMIN D-3) 1000 UNITS CAPS Take by mouth.   Yes [provider]  ferrous sulfate 325 (65 FE) MG tablet Take 1 tablet (325 mg total) by mouth 3 (three) times daily with meals. 07/14/15  Yes Shaune Pollack, MD  Multiple Vitamin (MULTIVITAMIN) tablet Take 1 tablet by mouth daily.   Yes [provider]  omega-3 fish oil (MAXEPA) 1000 MG CAPS capsule Take 3 capsules by mouth daily.   Yes [provider]  potassium chloride (K-DUR,KLOR-CON) 10 MEQ tablet Take 10 mEq by mouth daily.   Yes [provider]  triamterene-hydrochlorothiazide (MAXZIDE) 75-50 MG tablet Take 1 tablet by mouth daily. 06/26/20  Yes [provider]  vitamin B-12 (CYANOCOBALAMIN) 1000 MCG tablet Take 1,000 mcg by mouth daily.   Yes [provider]  vitamin E 1000 UNIT capsule Take 1,000 Units by mouth daily.   Yes [provider]  baclofen (LIORESAL) 10 MG tablet Take 1 tablet (10 mg total) by mouth 3 (three) times daily as needed for muscle spasms. 09/04/20   Tommie Sams, DO    Family History Family History  Problem Relation Age of Onset  . Diabetes Mother   . Heart failure Mother   . Breast cancer Mother   . Breast cancer Maternal Aunt   .  Breast cancer Maternal Grandmother     Social History Social History   Tobacco Use  . Smoking status: Former Smoker    Packs/day: 0.25    Years: 6.00    Pack years: 1.50    Types: Cigarettes    Quit date: 03/21/1989    Years since quitting: 31.4  . Smokeless tobacco: Never Used  Substance Use Topics  . Alcohol use: Yes    Comment: occasional glass of wine or mixed drink  . Drug use: No     Allergies   Other   Review of Systems Review of Systems  Musculoskeletal: Positive for neck pain.       Left trapezius tension.   Physical Exam Triage Vital Signs ED Triage Vitals   Enc Vitals Group     BP 09/04/20 0945 (!) 170/89     Pulse Rate 09/04/20 0945 73     Resp 09/04/20 0945 18     Temp 09/04/20 0945 98.3 F (36.8 C)     Temp Source 09/04/20 0945 Oral     SpO2 09/04/20 0945 99 %     Weight 09/04/20 0942 198 lb (89.8 kg)     Height 09/04/20 0942 5\' 9"  (1.753 m)     Head Circumference --      Peak Flow --      Pain Score 09/04/20 0942 10     Pain Loc --      Pain Edu? --      Excl. in GC? --    Updated Vital Signs BP (!) 170/89 (BP Location: Left Arm)   Pulse 73   Temp 98.3 F (36.8 C) (Oral)   Resp 18   Ht 5\' 9"  (1.753 m)   Wt 89.8 kg   SpO2 99%   BMI 29.24 kg/m   Visual Acuity Right Eye Distance:   Left Eye Distance:   Bilateral Distance:    Right Eye Near:   Left Eye Near:    Bilateral Near:     Physical Exam Vitals and nursing note reviewed.  Constitutional:      General: She is not in acute distress.    Appearance: Normal appearance. She is not ill-appearing.  HENT:     Head: Normocephalic and atraumatic.  Eyes:     General:        Right eye: No discharge.        Left eye: No discharge.     Conjunctiva/sclera: Conjunctivae normal.  Pulmonary:     Effort: Pulmonary effort is normal. No respiratory distress.  Musculoskeletal:     Comments: Patient with tenderness to palpation around the left scapula.  Neurological:     Mental Status: She is alert.  Psychiatric:        Mood and Affect: Mood normal.        Behavior: Behavior normal.    UC Treatments / Results  Labs (all labs ordered are listed, but only abnormal results are displayed) Labs Reviewed - No data to display  EKG   Radiology No results found.  Procedures Procedures (including critical care time)  Medications Ordered in UC Medications - No data to display  Initial Impression / Assessment and Plan / UC Course  I have reviewed the triage vital signs and the nursing notes.  Pertinent labs & imaging results that were available during my care of the  patient were reviewed by me and considered in my medical decision making (see chart for details).    74 year old female presents with  muscle spasm.  Advise heat.  Gentle range of motion.  Supportive care.  Baclofen as prescribed.  Final Clinical Impressions(s) / UC Diagnoses   Final diagnoses:  Muscle spasm     Discharge Instructions     Lots of heat.  Gentle range of motion.  Medication as prescribed.  Take care  Dr. Adriana Simas    ED Prescriptions    Medication Sig Dispense Auth. Provider   baclofen (LIORESAL) 10 MG tablet Take 1 tablet (10 mg total) by mouth 3 (three) times daily as needed for muscle spasms. 30 each Tommie Sams, DO     PDMP not reviewed this encounter.   Tommie Sams, DO 09/04/20 1139

## 2020-09-04 NOTE — Discharge Instructions (Addendum)
Lots of heat.  Gentle range of motion.  Medication as prescribed.  Take care  Dr. Adriana Simas

## 2022-05-06 DIAGNOSIS — I739 Peripheral vascular disease, unspecified: Secondary | ICD-10-CM | POA: Insufficient documentation

## 2022-06-26 ENCOUNTER — Encounter (INDEPENDENT_AMBULATORY_CARE_PROVIDER_SITE_OTHER): Payer: Medicare Other | Admitting: Vascular Surgery

## 2022-06-26 ENCOUNTER — Encounter (INDEPENDENT_AMBULATORY_CARE_PROVIDER_SITE_OTHER): Payer: Self-pay

## 2022-07-23 DIAGNOSIS — I70219 Atherosclerosis of native arteries of extremities with intermittent claudication, unspecified extremity: Secondary | ICD-10-CM | POA: Insufficient documentation

## 2022-07-23 NOTE — Progress Notes (Deleted)
MRN : [id number]  Dawn Santana is a 76 y.o. (06/07/1946) female who presents with chief complaint of check circulation.  History of Present Illness:    The patient is seen for evaluation of painful lower extremities and diminished pulses. Patient notes the pain is always associated with activity and is very consistent day today. Typically, the pain occurs at less than one block, progress is as activity continues to the point that the patient must stop walking. Resting including standing still for several minutes allows the patient to walk a similar distance before being forced to stop again. Uneven terrain and inclines shorten the distance. The pain has been progressive over the past several years. The patient denies any abrupt changes in claudication symptoms.  The patient states the inability to walk is causing problems with daily activities.  The patient denies rest pain or dangling of an extremity off the side of the bed during the night for relief. No open wounds or sores at this time. No prior interventions or surgeries.  No history of back problems or DJD of the lumbar sacral spine.   The patient's blood pressure has been stable and relatively well controlled. The patient denies amaurosis fugax or recent TIA symptoms. There are no recent neurological changes noted. The patient denies history of DVT, PE or superficial thrombophlebitis. The patient denies recent episodes of angina or shortness of breath.   No outpatient medications have been marked as taking for the 07/28/22 encounter (Appointment) with Delana Meyer, Dolores Lory, MD.    Past Medical History:  Diagnosis Date   Arthritis    knees and joints   Constipation    occasional   Hyperlipidemia    Hypertension    PONV (postoperative nausea and vomiting)    Rectal bleeding     Past Surgical History:  Procedure Laterality Date   ABDOMINAL HYSTERECTOMY     APPENDECTOMY     BREAST BIOPSY Right 02/19/2018    [place] bx path pending   BREAST EXCISIONAL BIOPSY Bilateral 1980's   neg   COLONOSCOPY N/A 07/11/2015   Procedure: COLONOSCOPY;  Surgeon: Hulen Luster, MD;  Location: [place];  Service: Gastroenterology;  Laterality: N/A;   POLYPECTOMY  07/11/2015   Procedure: POLYPECTOMY INTESTINAL;  Surgeon: Hulen Luster, MD;  Location: [facility];  Service: Gastroenterology;;    Social History Social History   Tobacco Use   Smoking status: Former    Packs/day: 0.25    Years: 6.00    Total pack years: 1.50    Types: Cigarettes    Quit date: 03/21/1989    Years since quitting: 33.3   Smokeless tobacco: Never  Substance Use Topics   Alcohol use: Yes    Comment: occasional glass of wine or mixed drink   Drug use: No    Family History Family History  Problem Relation Age of Onset   Diabetes Mother    Heart failure Mother    Breast cancer Mother    Breast cancer Maternal Aunt    Breast cancer Maternal Grandmother     Allergies  Allergen Reactions   Other Other (See Comments)    Statins-hmg-coa reductase inhibitors give muscle aches     REVIEW OF SYSTEMS (Negative unless checked)  Constitutional: [] Weight loss  [] Fever  [] Chills Cardiac: [] Chest pain   [] Chest pressure   [] Palpitations   [] Shortness of breath when laying flat   [] Shortness  of breath with exertion. Vascular:  [x] Pain in legs with walking   [] Pain in legs at rest  [] History of DVT   [] Phlebitis   [] Swelling in legs   [] Varicose veins   [] Non-healing ulcers Pulmonary:   [] Uses home oxygen   [] Productive cough   [] Hemoptysis   [] Wheeze  [] COPD   [] Asthma Neurologic:  [] Dizziness   [] Seizures   [] History of stroke   [] History of TIA  [] Aphasia   [] Vissual changes   [] Weakness or numbness in arm   [] Weakness or numbness in leg Musculoskeletal:   [] Joint swelling   [] Joint pain   [] Low back pain Hematologic:  [] Easy bruising  [] Easy bleeding   [] Hypercoagulable state   [] Anemic Gastrointestinal:  [] Diarrhea    [] Vomiting  [] Gastroesophageal reflux/heartburn   [] Difficulty swallowing. Genitourinary:  [] Chronic kidney disease   [] Difficult urination  [] Frequent urination   [] Blood in urine Skin:  [] Rashes   [] Ulcers  Psychological:  [] History of anxiety   []  History of major depression.  Physical Examination  There were no vitals filed for this visit. There is no height or weight on file to calculate BMI. Gen: WD/WN, NAD Head: [place]/AT, No temporalis wasting.  Ear/Nose/Throat: Hearing grossly intact, nares w/o erythema or drainage Eyes: PER, EOMI, sclera nonicteric.  Neck: Supple, no masses.  No bruit or JVD.  Pulmonary:  Good air movement, no audible wheezing, no use of accessory muscles.  Cardiac: RRR, normal S1, S2, no Murmurs. Vascular:  mild trophic changes, no open wounds Vessel Right Left  Radial Palpable Palpable  PT Not Palpable Not Palpable  DP Not Palpable Not Palpable  Gastrointestinal: soft, non-distended. No guarding/no peritoneal signs.  Musculoskeletal: M/S 5/5 throughout.  No visible deformity.  Neurologic: CN 2-12 intact. Pain and light touch intact in extremities.  Symmetrical.  Speech is fluent. Motor exam as listed above. Psychiatric: Judgment intact, Mood & affect appropriate for pt's clinical situation. Dermatologic: No rashes or ulcers noted.  No changes consistent with cellulitis.   CBC Lab Results  Component Value Date   WBC 7.1 07/14/2015   HGB 8.7 (L) 07/14/2015   HCT 25.1 (L) 07/14/2015   MCV 88.5 07/14/2015   PLT 189 07/14/2015    BMET    Component Value Date/Time   NA 142 07/13/2015 0528   K 3.3 (L) 07/14/2015 0501   CL 113 (H) 07/13/2015 0528   CO2 25 07/13/2015 0528   GLUCOSE 104 (H) 07/13/2015 0528   BUN 5 (L) 07/13/2015 0528   CREATININE 0.69 07/13/2015 0528   CALCIUM 8.0 (L) 07/13/2015 0528   GFRNONAA >60 07/13/2015 0528   GFRAA >60 07/13/2015 0528   CrCl cannot be calculated (Patient's most recent lab result is older than the maximum 21  days allowed.).  COAG Lab Results  Component Value Date   INR 1.18 07/12/2015    Radiology No results found.   Assessment/Plan There are no diagnoses linked to this encounter.   , MD  07/23/2022 2:45 PM

## 2022-07-25 ENCOUNTER — Other Ambulatory Visit (INDEPENDENT_AMBULATORY_CARE_PROVIDER_SITE_OTHER): Payer: Self-pay | Admitting: Vascular Surgery

## 2022-07-25 DIAGNOSIS — R6889 Other general symptoms and signs: Secondary | ICD-10-CM

## 2022-07-28 ENCOUNTER — Encounter (INDEPENDENT_AMBULATORY_CARE_PROVIDER_SITE_OTHER): Payer: Self-pay | Admitting: Vascular Surgery

## 2022-07-28 ENCOUNTER — Encounter (INDEPENDENT_AMBULATORY_CARE_PROVIDER_SITE_OTHER): Payer: Self-pay

## 2022-07-28 ENCOUNTER — Ambulatory Visit (INDEPENDENT_AMBULATORY_CARE_PROVIDER_SITE_OTHER): Payer: Medicare Other | Admitting: Vascular Surgery

## 2022-07-28 ENCOUNTER — Ambulatory Visit (INDEPENDENT_AMBULATORY_CARE_PROVIDER_SITE_OTHER): Payer: Medicare Other

## 2022-07-28 ENCOUNTER — Encounter (INDEPENDENT_AMBULATORY_CARE_PROVIDER_SITE_OTHER): Payer: Medicare Other | Admitting: Vascular Surgery

## 2022-07-28 VITALS — BP 156/85 | HR 92 | Resp 18 | Ht 71.0 in | Wt 193.0 lb

## 2022-07-28 DIAGNOSIS — E7800 Pure hypercholesterolemia, unspecified: Secondary | ICD-10-CM

## 2022-07-28 DIAGNOSIS — I10 Essential (primary) hypertension: Secondary | ICD-10-CM

## 2022-07-28 DIAGNOSIS — M159 Polyosteoarthritis, unspecified: Secondary | ICD-10-CM | POA: Diagnosis not present

## 2022-07-28 DIAGNOSIS — R6889 Other general symptoms and signs: Secondary | ICD-10-CM

## 2022-07-28 DIAGNOSIS — I739 Peripheral vascular disease, unspecified: Secondary | ICD-10-CM | POA: Diagnosis not present

## 2022-07-28 DIAGNOSIS — M1990 Unspecified osteoarthritis, unspecified site: Secondary | ICD-10-CM | POA: Insufficient documentation

## 2022-07-28 NOTE — Progress Notes (Signed)
MRN : [id number]  Dawn Santana is a 76 y.o. ([birth year]) female who presents with chief complaint of check circulation.  History of Present Illness:   The patient is seen for evaluation of painful lower extremities. Patient notes the pain is variable and not always associated with activity.  The pain is somewhat consistent day to day occurring on most days. The patient notes the pain also occurs with standing and routinely seems worse as the day wears on. The pain has been progressive over the past several years. The patient states these symptoms are causing  a negative impact on quality of life and daily activities which was a factor in the referral.  The patient has a  history of back problems and DJD of the lumbar and sacral spine.   The patient denies rest pain or dangling of an extremity off the side of the bed during the night for relief. No open wounds or sores at this time. No history of DVT or phlebitis. No prior vascular interventions or surgeries.  ABI's are essentially normal    Current Meds  Medication Sig   Ascorbic Acid (VITAMIN C) 1000 MG tablet Take 1,000 mg by mouth daily.   Calcium-Magnesium-Vitamin D 185-50-100 MG-MG-UNIT CAPS Take 2 tablets by mouth daily.   Cholecalciferol (VITAMIN D-3) 1000 UNITS CAPS Take by mouth.   ferrous sulfate 325 (65 FE) MG tablet Take 1 tablet (325 mg total) by mouth 3 (three) times daily with meals.   Multiple Vitamin (MULTIVITAMIN) tablet Take 1 tablet by mouth daily.   omega-3 fish oil (MAXEPA) 1000 MG CAPS capsule Take 3 capsules by mouth daily.   potassium chloride (K-DUR,KLOR-CON) 10 MEQ tablet Take 10 mEq by mouth daily.   triamterene-hydrochlorothiazide (MAXZIDE) 75-50 MG tablet Take 1 tablet by mouth daily.   vitamin B-12 (CYANOCOBALAMIN) 1000 MCG tablet Take 1,000 mcg by mouth daily.   vitamin E 1000 UNIT capsule Take 1,000 Units by mouth daily.    Past Medical History:  Diagnosis Date   Arthritis     knees and joints   Constipation    occasional   Hyperlipidemia    Hypertension    PONV (postoperative nausea and vomiting)    Rectal bleeding     Past Surgical History:  Procedure Laterality Date   ABDOMINAL HYSTERECTOMY     APPENDECTOMY     BREAST BIOPSY Right 02/19/2018   [place] bx path pending   BREAST EXCISIONAL BIOPSY Bilateral 1980's   neg   COLONOSCOPY N/A 07/11/2015   Procedure: COLONOSCOPY;  Surgeon: Hulen Luster, MD;  Location: [place];  Service: Gastroenterology;  Laterality: N/A;   POLYPECTOMY  07/11/2015   Procedure: POLYPECTOMY INTESTINAL;  Surgeon: Hulen Luster, MD;  Location: [facility];  Service: Gastroenterology;;    Social History Social History   Tobacco Use   Smoking status: Former    Packs/day: 0.25    Years: 6.00    Total pack years: 1.50    Types: Cigarettes    Quit date: 03/21/1989    Years since quitting: 33.3   Smokeless tobacco: Never  Substance Use Topics   Alcohol use: Yes    Comment: occasional glass of wine or mixed drink   Drug use: No    Family History Family History  Problem Relation Age of Onset   Diabetes Mother    Heart failure Mother    Breast cancer Mother  Breast cancer Maternal Aunt    Breast cancer Maternal Grandmother     Allergies  Allergen Reactions   Other Other (See Comments)    Statins-hmg-coa reductase inhibitors give muscle aches     REVIEW OF SYSTEMS (Negative unless checked)  Constitutional: [] Weight loss  [] Fever  [] Chills Cardiac: [] Chest pain   [] Chest pressure   [] Palpitations   [] Shortness of breath when laying flat   [] Shortness of breath with exertion. Vascular:  [x] Pain in legs with walking   [] Pain in legs at rest  [] History of DVT   [] Phlebitis   [] Swelling in legs   [] Varicose veins   [] Non-healing ulcers Pulmonary:   [] Uses home oxygen   [] Productive cough   [] Hemoptysis   [] Wheeze  [] COPD   [] Asthma Neurologic:  [] Dizziness   [] Seizures   [] History of stroke   [] History of  TIA  [] Aphasia   [] Vissual changes   [] Weakness or numbness in arm   [] Weakness or numbness in leg Musculoskeletal:   [] Joint swelling   [] Joint pain   [] Low back pain Hematologic:  [] Easy bruising  [] Easy bleeding   [] Hypercoagulable state   [] Anemic Gastrointestinal:  [] Diarrhea   [] Vomiting  [] Gastroesophageal reflux/heartburn   [] Difficulty swallowing. Genitourinary:  [] Chronic kidney disease   [] Difficult urination  [] Frequent urination   [] Blood in urine Skin:  [] Rashes   [] Ulcers  Psychological:  [] History of anxiety   []  History of major depression.  Physical Examination  Vitals:   07/28/22 1105  BP: (!) 156/85  Pulse: 92  Resp: 18  Weight: 193 lb (87.5 kg)  Height: 5\' 11"  (1.803 m)   Body mass index is 26.92 kg/m. Gen: WD/WN, NAD Head: [place]/AT, No temporalis wasting.  Ear/Nose/Throat: Hearing grossly intact, nares w/o erythema or drainage Eyes: PER, EOMI, sclera nonicteric.  Neck: Supple, no masses.  No bruit or JVD.  Pulmonary:  Good air movement, no audible wheezing, no use of accessory muscles.  Cardiac: RRR, normal S1, S2, no Murmurs. Vascular:  mild trophic changes, no open wounds Vessel Right Left  Radial Palpable Palpable  PT Palpable  Palpable  DP  Palpable  Palpable  Gastrointestinal: soft, non-distended. No guarding/no peritoneal signs.  Musculoskeletal: M/S 5/5 throughout.  No visible deformity.  Neurologic: CN 2-12 intact. Pain and light touch intact in extremities.  Symmetrical.  Speech is fluent. Motor exam as listed above. Psychiatric: Judgment intact, Mood & affect appropriate for pt's clinical situation. Dermatologic: No rashes or ulcers noted.  No changes consistent with cellulitis.   CBC Lab Results  Component Value Date   WBC 7.1 07/14/2015   HGB 8.7 (L) 07/14/2015   HCT 25.1 (L) 07/14/2015   MCV 88.5 07/14/2015   PLT 189 07/14/2015    BMET    Component Value Date/Time   NA 142 07/13/2015 0528   K 3.3 (L) 07/14/2015 0501   CL 113 (H)  07/13/2015 0528   CO2 25 07/13/2015 0528   GLUCOSE 104 (H) 07/13/2015 0528   BUN 5 (L) 07/13/2015 0528   CREATININE 0.69 07/13/2015 0528   CALCIUM 8.0 (L) 07/13/2015 0528   GFRNONAA >60 07/13/2015 0528   GFRAA >60 07/13/2015 0528   CrCl cannot be calculated (Patient's most recent lab result is older than the maximum 21 days allowed.).  COAG Lab Results  Component Value Date   INR 1.18 07/12/2015    Radiology No results found.   Assessment/Plan 1. Abnormal finding on screening procedure Recommend:  The patient has atypical pain symptoms for vascular disease  and on exam I do not find evidence of vascular pathology that would explain the patient's symptoms.  Noninvasive studies do not identify significant vascular problems  I suspect the patient is c/o pseudoclaudication.  Patient should have an evaluation of the LS spine which I defer to the primary service or the Spine service.  The patient should continue walking and begin a more formal exercise program. The patient should continue his antiplatelet therapy and aggressive treatment of the lipid abnormalities.  Patient will follow-up with me on a PRN basis.  - VAS [place] ABI WITH/WO TBI  2. PAD (peripheral artery disease) (HCC) Recommend:  The patient has atypical pain symptoms for vascular disease and on exam I do not find evidence of vascular pathology that would explain the patient's symptoms.  Noninvasive studies do not identify significant vascular problems  I suspect the patient is c/o pseudoclaudication.  Patient should have an evaluation of the LS spine which I defer to the primary service or the Spine service.  The patient should continue walking and begin a more formal exercise program. The patient should continue his antiplatelet therapy and aggressive treatment of the lipid abnormalities.  Patient will follow-up with me on a PRN basis.   3. Primary hypertension Continue antihypertensive medications as already  ordered, these medications have been reviewed and there are no changes at this time.   4. Primary osteoarthritis involving multiple joints Continue NSAID medications as already ordered, these medications have been reviewed and there are no changes at this time.  Continued activity and therapy was stressed.   5. Hypercholesterolemia Continue statin as ordered and reviewed, no changes at this time      Levora Dredge, MD  07/28/2022 11:14 AM

## 2022-09-11 ENCOUNTER — Encounter (INDEPENDENT_AMBULATORY_CARE_PROVIDER_SITE_OTHER): Payer: Medicare Other | Admitting: Vascular Surgery

## 2022-09-11 ENCOUNTER — Encounter (INDEPENDENT_AMBULATORY_CARE_PROVIDER_SITE_OTHER): Payer: Self-pay

## 2022-11-14 ENCOUNTER — Other Ambulatory Visit: Payer: Self-pay | Admitting: Gerontology

## 2022-11-14 DIAGNOSIS — N6489 Other specified disorders of breast: Secondary | ICD-10-CM

## 2022-11-26 ENCOUNTER — Other Ambulatory Visit: Payer: Medicare Other

## 2022-12-01 ENCOUNTER — Ambulatory Visit
Admission: RE | Admit: 2022-12-01 | Discharge: 2022-12-01 | Disposition: A | Discharge status: Home / Self Care | Payer: 59 | Source: Ambulatory Visit | Attending: Gerontology | Admitting: Gerontology

## 2022-12-01 DIAGNOSIS — N6489 Other specified disorders of breast: Secondary | ICD-10-CM | POA: Insufficient documentation

## 2022-12-02 ENCOUNTER — Other Ambulatory Visit: Payer: Self-pay | Admitting: Gerontology

## 2022-12-02 DIAGNOSIS — R928 Other abnormal and inconclusive findings on diagnostic imaging of breast: Secondary | ICD-10-CM

## 2022-12-02 DIAGNOSIS — R921 Mammographic calcification found on diagnostic imaging of breast: Secondary | ICD-10-CM

## 2022-12-05 ENCOUNTER — Inpatient Hospital Stay: Admission: RE | Admit: 2022-12-05 | Payer: 59 | Source: Ambulatory Visit

## 2022-12-09 ENCOUNTER — Ambulatory Visit
Admission: RE | Admit: 2022-12-09 | Discharge: 2022-12-09 | Disposition: A | Discharge status: Home / Self Care | Payer: 59 | Source: Ambulatory Visit | Attending: Gerontology | Admitting: Gerontology

## 2022-12-09 DIAGNOSIS — R921 Mammographic calcification found on diagnostic imaging of breast: Secondary | ICD-10-CM

## 2022-12-09 DIAGNOSIS — R928 Other abnormal and inconclusive findings on diagnostic imaging of breast: Secondary | ICD-10-CM

## 2022-12-09 HISTORY — PX: BREAST BIOPSY: SHX20

## 2022-12-09 MED ORDER — LIDOCAINE HCL (PF) 1 % IJ SOLN
15.0000 mL | Freq: Once | INTRAMUSCULAR | Status: AC
  Administered 2022-12-09: 15 mL
  Filled 2022-12-09: qty 15

## 2022-12-09 MED ORDER — LIDOCAINE-EPINEPHRINE 1 %-1:100000 IJ SOLN
10.0000 mL | Freq: Once | INTRAMUSCULAR | Status: AC
  Administered 2022-12-09: 10 mL
  Filled 2022-12-09: qty 10

## 2022-12-10 ENCOUNTER — Encounter: Payer: Self-pay | Admitting: *Deleted

## 2022-12-10 LAB — SURGICAL PATHOLOGY

## 2022-12-10 NOTE — Progress Notes (Signed)
Referral recieved from [facility] Radiology for benign breast mass. After discussing with Dawn Santana an email was sent of the surgeons in [place].  She will discuss with her daughter and give me a call back with her choice.

## 2022-12-15 ENCOUNTER — Encounter: Payer: Self-pay | Admitting: *Deleted

## 2022-12-15 NOTE — Progress Notes (Signed)
Dawn Santana would like to see Dr. Peyton Najjar.  She wanted to wait until after easter, so she is scheduled for Tuesday 01/06/23 at 10:15.

## 2023-01-29 ENCOUNTER — Other Ambulatory Visit: Payer: Self-pay | Admitting: General Surgery

## 2023-01-29 ENCOUNTER — Ambulatory Visit: Payer: Self-pay | Admitting: General Surgery

## 2023-01-29 DIAGNOSIS — N6092 Unspecified benign mammary dysplasia of left breast: Secondary | ICD-10-CM

## 2023-01-29 NOTE — H&P (Signed)
PATIENT PROFILE: Dawn Santana is a 77 y.o. female who presents to the Clinic for consultation at the request of Rolan Lipa, NP for evaluation of left breast atypical ductal hyperplasia.  PCP:  Mechele Collin, NP  HISTORY OF PRESENT ILLNESS: Ms. Moler reports she had a follow-up diagnostic mammogram due to previous suspicious mass on the right breast.  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  At that time 2019 he was recommended to follow-up with mammogram in 6 months.  Patient got lost to follow-up.  Now in February 2024 she had the follow-up mammogram, but this time showing calcifications in the left breast.  No concerning area on the right breast.  Core needle biopsy of the calcification of the left breast showed atypical ductal hyperplasia and radial scar.  Patient denies any complaints.  Patient denies any nipple discharge.  Patient denies any skin changes.  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  Last Assessment & Plan:   Formatting of this note might be different from the original.  Noted, stable       History of lower gastrointestinal bleeding after polypectomy 03/31/2016   HTN (hypertension) 02/09/2014   Hypercholesterolemia 02/09/2014    GENERAL REVIEW OF SYSTEMS:   General ROS: negative for - chills, fatigue, fever, weight gain or weight loss Allergy and Immunology ROS: negative for - hives  Hematological and Lymphatic ROS: negative for - bleeding problems or bruising, negative for palpable  nodes Endocrine ROS: negative for - heat or cold intolerance, hair changes Respiratory ROS: negative for - cough, shortness of breath or wheezing Cardiovascular ROS: no chest pain or palpitations GI ROS: negative for nausea, vomiting, abdominal pain, diarrhea, constipation Musculoskeletal ROS: negative for - joint swelling or muscle pain Neurological ROS: negative for - confusion, syncope Dermatological ROS: negative for pruritus and rash Psychiatric: negative for anxiety, depression, difficulty sleeping and memory loss  MEDICATIONS: Current Outpatient Medications  Medication Sig Dispense Refill   acetaminophen (TYLENOL) 650 MG ER tablet Take 650 mg by mouth continuously as needed for Pain.     ascorbic acid, vitamin C, (VITAMIN C) 1000 MG tablet Take 1,000 mg by mouth once daily.     calcium-mag oxide-vitamin D3 185-50-100 mg-mg-unit Cap Take 2 tablets by mouth once daily.     carboxymethylcellulose sodium (REFRESH OPHTH) Apply 1 drop to eye 3 (three) times daily as needed     cholecalciferol (VITAMIN D3) 2,000 unit capsule Take 3 capsules daily for 3 months, then reduce to 1 capsule daily thereafter for Vitamin D Deficiency. 360 capsule 11   cyanocobalamin (VITAMIN B12) 1000 MCG tablet Take 1,000 mcg by mouth once daily.     flaxseed-omega3,6,9-fatty acid 1,200-540-132 mg Cap Take 2 tablets by mouth once daily.     garlic 1,000 mg Cap Take 1 capsule by mouth once daily 360 each 0   omega-3 fatty acids-vitamin E 1,000 mg Take 3 capsules by mouth once daily.     potassium chloride (KLOR-CON) 20 MEQ ER tablet TAKE 1 TABLET BY MOUTH ONCE  DAILY 90 tablet 3   triamterene-hydroCHLOROthiazide (MAXZIDE) 75-50 mg tablet TAKE 1 TABLET BY MOUTH DAILY 90 tablet 3   vitamin E 1000 UNIT capsule Take 1,000 Units by mouth once daily.     zinc sulfate (ZINC-220 ORAL) Take 1 tablet by mouth once daily     No current facility-administered medications for this visit.    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    PAST SURGICAL HISTORY: Past Surgical History:  Procedure Laterality Date   COLONOSCOPY  07/11/2015   Tubular adenoma/Tubulovillous adenoma of colon/Hyperplastic colon polyp/Repeat 67months/PYO   EXTRACTION CATARACT EXTRACAPSULAR W/INSERTION INTRAOCULAR PROSTHESIS Right 01/01/2017   Procedure: ORA-----EXTRACTION CATARACT EXTRACAPSULAR W/INSERTION INTRAOCULAR PROSTHESIS;  Surgeon: Timoteo Ace, MD;  [facility];  Service: Ophthalmology;  Laterality: Right;   EXTRACTION CATARACT EXTRACAPSULAR Left 01/08/2017   Procedure: ORA-----EXTRACTION CATARACT EXTRACAPSULAR;  Surgeon: Timoteo Ace, MD;  [facility];  Service: Ophthalmology;  Laterality: Left;  Dilation is particularly important for this procedure in this patient!   PERCUTANEOUS BIOPSY BREAST Right 02/19/2018   fibrocystic changes & MINUTE INTRADUCTAL PAPILLOMA, rec to repeat in 6 months   APPENDECTOMY     INCISION & DRAINAGE LYMPH NODE ABCESS     TAH/LSO       FAMILY HISTORY: Family History  Problem Relation Name Age of Onset   Heart failure Mother     Diabetes Mother     Cataracts Mother     Breast cancer Mother     High blood pressure (Hypertension) Sister     Cancer Brother     Glaucoma Neg Hx       SOCIAL HISTORY: Social History   Socioeconomic History   Marital status: Divorced  Tobacco Use   Smoking status: Former    Current packs/day: 0.00    Types: Cigarettes    Quit date: 03/21/1989    Years since quitting: 33.8    Smokeless tobacco: Never  Vaping Use   Vaping status: Never Used  Substance and Sexual Activity   Alcohol use: Yes    Alcohol/week: 0.0 standard drinks of alcohol    Comment: occasional   Drug use: No   Sexual activity: Defer    PHYSICAL EXAM: Vitals:   01/29/23 1040  BP: (!) 154/100  Pulse: (!) 132   Body mass index is 29.19 kg/m. Weight: 87.1 kg (192 lb)   GENERAL: Alert, active, oriented x3  HEENT: Pupils equal reactive to light. Extraocular movements are intact. Sclera clear. Palpebral conjunctiva normal red color.Pharynx clear.  NECK: Supple with no palpable mass and no adenopathy.  LUNGS: Sound clear with no rales rhonchi or wheezes.  HEART: Regular rhythm S1 and S2 without murmur.  BREAST: breasts appear normal, no suspicious masses, no skin or nipple changes or axillary nodes.  ABDOMEN: Soft and depressible, nontender with no palpable mass, no hepatomegaly.  EXTREMITIES: Well-developed well-nourished symmetrical with no dependent edema.  NEUROLOGICAL: Awake alert oriented, facial expression symmetrical, moving all extremities.  REVIEW OF DATA:  I have reviewed the following data today: Office Visit on 11/06/2022  Component Date Value   Glucose 11/06/2022 93    Sodium 11/06/2022 140    Potassium 11/06/2022 4.0    Chloride 11/06/2022 103    Carbon Dioxide (CO2) 11/06/2022 31.8    Urea Nitrogen (BUN) 11/06/2022 11    Creatinine 11/06/2022 0.8    Glomerular Filtration Ra* 11/06/2022 77    Calcium 11/06/2022 9.8    AST  11/06/2022 14    ALT  11/06/2022 17    Alk Phos (alkaline Phosp* 11/06/2022 57    Albumin 11/06/2022 4.4    Bilirubin, Total 11/06/2022 1.3 (H)    Protein, Total 11/06/2022 6.6    A/G Ratio 11/06/2022 2.0      ASSESSMENT: Ms. Coyt is a 77 y.o. female presenting for consultation for left breast calcification with atypical ductal hyperplasia.    Patient was oriented again about the pathology results. Surgical alternatives were discussed  with patient including radiofrequency tag excisional biopsy. Surgical technique and post operative care was discussed with patient. Risk of surgery was discussed with patient including but not limited to: wound infection, seroma, hematoma, brachial plexopathy, mondor's disease (thrombosis of small veins of breast), chronic wound pain, breast lymphedema, altered sensation to the nipple and cosmesis among others.   Patient was oriented about the implication of having atypical ductal hyperplasia.  She was oriented that the surgery is a diagnostic purpose, not treatment.  We discussed about upgrade of up to 20% to malignancy such as DCIS or invasive mammary carcinoma.  We also discussed the increased  risk of having breast malignancy in her lifetime.  We briefly discussed about the recommendation of seeing medical oncology after the excisional biopsy even if no cancer is identified for discussion of alternative for risk reducing therapies.  The patient reports she understood and agreed with plan.  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   Patient verbalized understanding, all questions were answered, and were agreeable with the plan outlined above.     Carolan Shiver, MD  Electronically signed by Carolan Shiver, MD

## 2023-01-31 ENCOUNTER — Encounter: Payer: Self-pay | Admitting: Urgent Care

## 2023-02-03 ENCOUNTER — Inpatient Hospital Stay: Admission: RE | Admit: 2023-02-03 | Payer: 59 | Source: Ambulatory Visit

## 2023-02-04 ENCOUNTER — Inpatient Hospital Stay: Admission: RE | Admit: 2023-02-04 | Payer: 59 | Source: Ambulatory Visit

## 2023-02-09 ENCOUNTER — Encounter: Admission: RE | Payer: Self-pay | Source: Home / Self Care

## 2023-02-09 ENCOUNTER — Ambulatory Visit: Admission: RE | Admit: 2023-02-09 | Payer: 59 | Source: Home / Self Care | Admitting: General Surgery

## 2023-02-09 SURGERY — BREAST BIOPSY WITH RADIO FREQUENCY LOCALIZER
Anesthesia: General | Laterality: Left

## 2023-04-15 ENCOUNTER — Other Ambulatory Visit: Payer: Self-pay | Admitting: Internal Medicine

## 2023-04-15 DIAGNOSIS — I4891 Unspecified atrial fibrillation: Secondary | ICD-10-CM

## 2023-04-15 DIAGNOSIS — E7800 Pure hypercholesterolemia, unspecified: Secondary | ICD-10-CM

## 2023-04-16 ENCOUNTER — Ambulatory Visit
Admission: RE | Admit: 2023-04-16 | Discharge: 2023-04-16 | Disposition: A | Discharge status: Home / Self Care | Payer: 59 | Source: Ambulatory Visit | Attending: Internal Medicine | Admitting: Internal Medicine

## 2023-04-16 DIAGNOSIS — E7800 Pure hypercholesterolemia, unspecified: Secondary | ICD-10-CM | POA: Insufficient documentation

## 2023-04-16 DIAGNOSIS — I4891 Unspecified atrial fibrillation: Secondary | ICD-10-CM | POA: Insufficient documentation

## 2023-08-13 ENCOUNTER — Other Ambulatory Visit: Payer: Self-pay | Admitting: Internal Medicine

## 2023-08-13 DIAGNOSIS — R079 Chest pain, unspecified: Secondary | ICD-10-CM

## 2023-08-17 ENCOUNTER — Other Ambulatory Visit: Payer: 59
